# Patient Record
Sex: Male | Born: 1950 | Race: White | Hispanic: No | Marital: Single
Health system: Midwestern US, Academic
[De-identification: ages and names within clinical notes are randomized; demographics above are authoritative.]

---

## 2017-05-18 ENCOUNTER — Encounter: Admit: 2017-05-18 | Discharge: 2017-05-18 | Payer: MEDICARE

## 2017-05-18 DIAGNOSIS — M79602 Pain in left arm: ICD-10-CM

## 2017-05-18 DIAGNOSIS — S42309A Unspecified fracture of shaft of humerus, unspecified arm, initial encounter for closed fracture: ICD-10-CM

## 2017-05-18 DIAGNOSIS — I10 Essential (primary) hypertension: Principal | ICD-10-CM

## 2017-05-18 DIAGNOSIS — C61 Malignant neoplasm of prostate: ICD-10-CM

## 2017-05-18 DIAGNOSIS — R69 Illness, unspecified: ICD-10-CM

## 2017-05-18 MED ORDER — NALOXONE 0.4 MG/ML IJ SOLN
.08 mg | INTRAVENOUS | 0 refills | Status: DC | PRN
Start: 2017-05-18 — End: 2017-05-19

## 2017-05-18 MED ORDER — AMPICILLIN/SULBACTAM 3G/100ML NS IVPB (MB+)
3 g | INTRAVENOUS | 0 refills | Status: DC
Start: 2017-05-18 — End: 2017-05-26
  Administered 2017-05-19 – 2017-05-26 (×59): 3 g via INTRAVENOUS

## 2017-05-18 MED ORDER — DIPHTH,PERTUS(ACELL),TETANUS 2.5-8-5 LF-MCG-LF/0.5ML IM SUSP
.5 mL | Freq: Once | INTRAMUSCULAR | 0 refills | Status: CP
Start: 2017-05-18 — End: ?
  Administered 2017-05-19: 09:00:00 0.5 mL via INTRAMUSCULAR

## 2017-05-18 MED ORDER — SODIUM CHLORIDE 0.9 % IV SOLP
INTRAVENOUS | 0 refills | Status: DC
Start: 2017-05-18 — End: 2017-05-19
  Administered 2017-05-19 (×2): 1000.000 mL via INTRAVENOUS

## 2017-05-18 MED ORDER — KETAMINE 10 MG/ML IJ SOLN
.5 mg/kg | Freq: Once | INTRAVENOUS | 0 refills | Status: CP
Start: 2017-05-18 — End: ?
  Administered 2017-05-19: 05:00:00 39 mg via INTRAVENOUS

## 2017-05-18 MED ORDER — HYDROMORPHONE (PF) 2 MG/ML IJ SYRG
.5-1 mg | INTRAVENOUS | 0 refills | Status: DC | PRN
Start: 2017-05-18 — End: 2017-05-19
  Administered 2017-05-19 (×2): 1 mg via INTRAVENOUS

## 2017-05-18 MED ORDER — HYDROMORPHONE (PF)-0.9 % NACL 10 MG/50 ML SYR (STD CONC)(ADULT)(PREMADE)
INTRAVENOUS | 0 refills | Status: DC
Start: 2017-05-18 — End: 2017-05-19
  Administered 2017-05-19: 06:00:00 50.000 mL via INTRAVENOUS

## 2017-05-19 ENCOUNTER — Encounter: Admit: 2017-05-19 | Discharge: 2017-05-19 | Payer: MEDICARE

## 2017-05-19 ENCOUNTER — Inpatient Hospital Stay: Admit: 2017-05-19 | Discharge: 2017-05-19 | Payer: MEDICARE

## 2017-05-19 DIAGNOSIS — M79602 Pain in left arm: ICD-10-CM

## 2017-05-19 DIAGNOSIS — C61 Malignant neoplasm of prostate: ICD-10-CM

## 2017-05-19 DIAGNOSIS — I10 Essential (primary) hypertension: Principal | ICD-10-CM

## 2017-05-19 LAB — COMPREHENSIVE METABOLIC PANEL
Lab: 0.6 mg/dL (ref 0.3–1.2)
Lab: 1 mg/dL (ref 0.4–1.24)
Lab: 10 10*3/uL — ABNORMAL HIGH (ref 3–12)
Lab: 135 MMOL/L — ABNORMAL LOW (ref 137–147)
Lab: 17 MMOL/L — ABNORMAL LOW (ref 21–30)
Lab: 25 U/L (ref 7–56)
Lab: 34 U/L (ref 7–40)
Lab: 4.2 g/dL — ABNORMAL HIGH (ref 3.5–5.0)
Lab: 6.6 g/dL (ref 6.0–8.0)
Lab: 64 U/L — ABNORMAL LOW (ref 25–110)
Lab: 9.2 mg/dL (ref 8.5–10.6)
Lab: >60 mL/min — ABNORMAL HIGH (ref >60)
Lab: >60 mL/min — ABNORMAL LOW (ref >60)

## 2017-05-19 LAB — CBC AND DIFF
Lab: 0 10*3/uL (ref 0–0.20)
Lab: 0 10*3/uL (ref 0–0.45)
Lab: 15 10*3/uL — ABNORMAL HIGH (ref 4.5–11.0)

## 2017-05-19 LAB — PHOSPHORUS: Lab: 3.7 mg/dL — ABNORMAL LOW (ref 2.0–4.5)

## 2017-05-19 LAB — LACTIC ACID(LACTATE): Lab: 0.8 MMOL/L (ref 0.5–2.0)

## 2017-05-19 LAB — MAGNESIUM: Lab: 1.9 mg/dL — ABNORMAL LOW (ref 1.6–2.6)

## 2017-05-19 LAB — IONIZED CALCIUM: Lab: 1.1 MMOL/L — ABNORMAL HIGH (ref 1.0–1.3)

## 2017-05-19 MED ORDER — HYDROMORPHONE (PF) 10 MG/ML IJ SOLN
1 mg | Freq: Once | INTRAVENOUS | 0 refills | Status: DC
Start: 2017-05-19 — End: 2017-05-19

## 2017-05-19 MED ORDER — POLYETHYLENE GLYCOL 3350 17 GRAM PO PWPK
1 | Freq: Every day | ORAL | 0 refills | Status: DC
Start: 2017-05-19 — End: 2017-05-28
  Administered 2017-05-19 – 2017-05-25 (×3): 17 g via ORAL

## 2017-05-19 MED ORDER — DEXAMETHASONE SODIUM PHOSPHATE 4 MG/ML IJ SOLN
INTRAVENOUS | 0 refills | Status: DC
Start: 2017-05-19 — End: 2017-05-19
  Administered 2017-05-19: 07:00:00 4 mg via INTRAVENOUS

## 2017-05-19 MED ORDER — KETAMINE 10 MG/ML IJ SOLN
0 refills | Status: DC
Start: 2017-05-19 — End: 2017-05-19
  Administered 2017-05-19: 06:00:00 30 mg via INTRAVENOUS

## 2017-05-19 MED ORDER — SENNOSIDES 8.6 MG PO TAB
1 | Freq: Two times a day (BID) | ORAL | 0 refills | Status: DC
Start: 2017-05-19 — End: 2017-05-28
  Administered 2017-05-19 – 2017-05-28 (×12): 1 via ORAL

## 2017-05-19 MED ORDER — DEXTRAN 70-HYPROMELLOSE (PF) 0.1-0.3 % OP DPET
0 refills | Status: DC
Start: 2017-05-19 — End: 2017-05-19
  Administered 2017-05-19: 06:00:00 1 [drp] via OPHTHALMIC

## 2017-05-19 MED ORDER — OXYCODONE 5 MG PO TAB
5-10 mg | ORAL | 0 refills | Status: DC | PRN
Start: 2017-05-19 — End: 2017-05-19
  Administered 2017-05-19 (×2): 5 mg via ORAL

## 2017-05-19 MED ORDER — SODIUM CHLORIDE 0.9 % IV SOLP
0 refills | Status: DC
Start: 2017-05-19 — End: 2017-05-19
  Administered 2017-05-19: 06:00:00 via INTRAVENOUS

## 2017-05-19 MED ORDER — ONDANSETRON HCL (PF) 4 MG/2 ML IJ SOLN
4 mg | INTRAVENOUS | 0 refills | Status: DC | PRN
Start: 2017-05-19 — End: 2017-05-19
  Administered 2017-05-19: 09:00:00 4 mg via INTRAVENOUS

## 2017-05-19 MED ORDER — ENOXAPARIN 30 MG/0.3 ML SC SYRG
30 mg | Freq: Two times a day (BID) | SUBCUTANEOUS | 0 refills | Status: DC
Start: 2017-05-19 — End: 2017-05-28
  Administered 2017-05-19 – 2017-05-28 (×17): 30 mg via SUBCUTANEOUS

## 2017-05-19 MED ORDER — CITALOPRAM 20 MG PO TAB
20 mg | Freq: Every day | ORAL | 0 refills | Status: DC
Start: 2017-05-19 — End: 2017-05-28
  Administered 2017-05-19 – 2017-05-28 (×7): 20 mg via ORAL

## 2017-05-19 MED ORDER — SUGAMMADEX 100 MG/ML IV SOLN
INTRAVENOUS | 0 refills | Status: DC
Start: 2017-05-19 — End: 2017-05-19
  Administered 2017-05-19: 08:00:00 180 mg via INTRAVENOUS

## 2017-05-19 MED ORDER — MELATONIN 3 MG PO TAB
3 mg | Freq: Every evening | ORAL | 0 refills | Status: DC
Start: 2017-05-19 — End: 2017-05-27
  Administered 2017-05-20 – 2017-05-27 (×8): 3 mg via ORAL

## 2017-05-19 MED ORDER — GABAPENTIN 300 MG PO CAP
300 mg | ORAL | 0 refills | Status: DC
Start: 2017-05-19 — End: 2017-05-28
  Administered 2017-05-19 – 2017-05-28 (×25): 300 mg via ORAL

## 2017-05-19 MED ORDER — MIDAZOLAM 1 MG/ML IJ SOLN
INTRAVENOUS | 0 refills | Status: DC
Start: 2017-05-19 — End: 2017-05-19
  Administered 2017-05-19: 06:00:00 2 mg via INTRAVENOUS

## 2017-05-19 MED ORDER — MAGNESIUM HYDROXIDE 2,400 MG/10 ML PO SUSP
20 mL | Freq: Two times a day (BID) | ORAL | 0 refills | Status: DC
Start: 2017-05-19 — End: 2017-05-28
  Administered 2017-05-20: 14:00:00 20 mL via ORAL

## 2017-05-19 MED ORDER — OXYCODONE 5 MG PO TAB
5-15 mg | ORAL | 0 refills | Status: DC | PRN
Start: 2017-05-19 — End: 2017-05-20
  Administered 2017-05-19 (×2): 10 mg via ORAL
  Administered 2017-05-20 (×2): 15 mg via ORAL
  Administered 2017-05-20: 12:00:00 10 mg via ORAL

## 2017-05-19 MED ORDER — METHOCARBAMOL 750 MG PO TAB
750 mg | Freq: Three times a day (TID) | ORAL | 0 refills | Status: DC
Start: 2017-05-19 — End: 2017-05-28
  Administered 2017-05-19 – 2017-05-28 (×25): 750 mg via ORAL

## 2017-05-19 MED ORDER — SIMVASTATIN 20 MG PO TAB
80 mg | Freq: Every evening | ORAL | 0 refills | Status: DC
Start: 2017-05-19 — End: 2017-05-28
  Administered 2017-05-20 – 2017-05-28 (×9): 80 mg via ORAL

## 2017-05-19 MED ORDER — PHENYLEPHRINE IN 0.9% NACL(PF) 1 MG/10 ML (100 MCG/ML) IV SYRG
INTRAVENOUS | 0 refills | Status: DC
Start: 2017-05-19 — End: 2017-05-19
  Administered 2017-05-19 (×9): 100 ug via INTRAVENOUS
  Administered 2017-05-19: 07:00:00 200 ug via INTRAVENOUS
  Administered 2017-05-19 (×5): 100 ug via INTRAVENOUS

## 2017-05-19 MED ORDER — ONDANSETRON HCL (PF) 4 MG/2 ML IJ SOLN
INTRAVENOUS | 0 refills | Status: DC
Start: 2017-05-19 — End: 2017-05-19
  Administered 2017-05-19: 07:00:00 4 mg via INTRAVENOUS

## 2017-05-19 MED ORDER — ACETAMINOPHEN 325 MG PO TAB
650 mg | ORAL | 0 refills | Status: DC
Start: 2017-05-19 — End: 2017-05-28
  Administered 2017-05-19 – 2017-05-28 (×34): 650 mg via ORAL

## 2017-05-19 MED ORDER — ENALAPRIL MALEATE 10 MG PO TAB
10 mg | Freq: Every day | ORAL | 0 refills | Status: DC
Start: 2017-05-19 — End: 2017-05-20
  Administered 2017-05-19: 17:00:00 10 mg via ORAL

## 2017-05-19 MED ORDER — ROCURONIUM 10 MG/ML IV SOLN
INTRAVENOUS | 0 refills | Status: DC
Start: 2017-05-19 — End: 2017-05-19
  Administered 2017-05-19 (×2): 10 mg via INTRAVENOUS

## 2017-05-19 MED ORDER — SUCCINYLCHOLINE CHLORIDE 20 MG/ML IJ SOLN
INTRAVENOUS | 0 refills | Status: DC
Start: 2017-05-19 — End: 2017-05-19
  Administered 2017-05-19: 06:00:00 120 mg via INTRAVENOUS

## 2017-05-19 MED ORDER — PROPRANOLOL 120 MG PO CS24
120 mg | Freq: Every day | ORAL | 0 refills | Status: DC
Start: 2017-05-19 — End: 2017-05-20
  Administered 2017-05-19: 17:00:00 120 mg via ORAL

## 2017-05-19 MED ORDER — PROPOFOL INJ 10 MG/ML IV VIAL
0 refills | Status: DC
Start: 2017-05-19 — End: 2017-05-19
  Administered 2017-05-19: 06:00:00 100 mg via INTRAVENOUS
  Administered 2017-05-19: 07:00:00 50 mg via INTRAVENOUS

## 2017-05-19 MED ORDER — METHOCARBAMOL 750 MG PO TAB
750 mg | Freq: Two times a day (BID) | ORAL | 0 refills | Status: DC
Start: 2017-05-19 — End: 2017-05-19
  Administered 2017-05-19: 17:00:00 750 mg via ORAL

## 2017-05-19 MED ORDER — FENTANYL CITRATE (PF) 50 MCG/ML IJ SOLN
0 refills | Status: DC
Start: 2017-05-19 — End: 2017-05-19
  Administered 2017-05-19: 06:00:00 100 ug via INTRAVENOUS

## 2017-05-19 MED ORDER — HYDROMORPHONE (PF) 2 MG/ML IJ SYRG
1 mg | Freq: Once | INTRAVENOUS | 0 refills | Status: DC
Start: 2017-05-19 — End: 2017-05-19

## 2017-05-19 MED ORDER — ORTHO IRRIGATION 3 L BAG
Freq: Once | 0 refills | Status: DC
Start: 2017-05-19 — End: 2017-05-19

## 2017-05-19 MED ORDER — ONDANSETRON HCL (PF) 4 MG/2 ML IJ SOLN
4-8 mg | INTRAVENOUS | 0 refills | Status: DC | PRN
Start: 2017-05-19 — End: 2017-05-28
  Administered 2017-05-20 (×2): 4 mg via INTRAVENOUS

## 2017-05-19 MED ADMIN — SODIUM CHLORIDE 0.9 % IR SOLN [11403]: @ 05:00:00 | Stop: 2017-05-19 | NDC 00338004804

## 2017-05-20 ENCOUNTER — Inpatient Hospital Stay: Admit: 2017-05-20 | Discharge: 2017-05-20 | Payer: MEDICARE

## 2017-05-20 ENCOUNTER — Encounter: Admit: 2017-05-20 | Discharge: 2017-05-20 | Payer: MEDICARE

## 2017-05-20 DIAGNOSIS — M79602 Pain in left arm: Secondary | ICD-10-CM

## 2017-05-20 LAB — URINALYSIS DIPSTICK REFLEX TO CULTURE
Lab: NEGATIVE
Lab: NEGATIVE

## 2017-05-20 LAB — LACTIC ACID(LACTATE): Lab: 2.1 MMOL/L — ABNORMAL HIGH (ref 0.5–2.0)

## 2017-05-20 LAB — POC GLUCOSE: Lab: 168 mg/dL — ABNORMAL HIGH (ref 70–100)

## 2017-05-20 LAB — TSH WITH FREE T4 REFLEX: Lab: 0.9 uU/mL (ref 0.35–5.00)

## 2017-05-20 LAB — COMPREHENSIVE METABOLIC PANEL
Lab: 0.3 mg/dL — ABNORMAL HIGH (ref 0.3–1.2)
Lab: 1.1 mg/dL (ref 0.4–1.24)
Lab: 11 10*3/uL (ref 3–12)
Lab: 142 MMOL/L — ABNORMAL LOW (ref 137–147)
Lab: 158 mg/dL — ABNORMAL HIGH (ref 70–100)
Lab: 17 U/L — ABNORMAL HIGH (ref 7–56)
Lab: 22 mg/dL (ref 7–25)
Lab: 25 MMOL/L (ref 21–30)
Lab: 3 g/dL — ABNORMAL LOW (ref 3.5–5.0)
Lab: 30 U/L (ref 7–40)
Lab: 47 U/L (ref 25–110)
Lab: 5.4 g/dL — ABNORMAL LOW (ref 6.0–8.0)
Lab: 7.5 mg/dL — ABNORMAL LOW (ref 8.5–10.6)
Lab: >60 mL/min (ref >60)
Lab: >60 mL/min — ABNORMAL HIGH (ref >60)

## 2017-05-20 LAB — PHOSPHORUS
Lab: 2.8 mg/dL — ABNORMAL LOW (ref 2.0–4.5)
Lab: 2 mg/dL (ref 2.0–4.5)

## 2017-05-20 LAB — TROPONIN-I: Lab: 0 ng/mL (ref 0.0–0.05)

## 2017-05-20 LAB — URINALYSIS MICROSCOPIC REFLEX TO CULTURE

## 2017-05-20 LAB — BASIC METABOLIC PANEL: Lab: 136 MMOL/L — ABNORMAL LOW (ref >60)

## 2017-05-20 LAB — CBC AND DIFF
Lab: 14 K/UL — ABNORMAL HIGH (ref 4.5–11.0)
Lab: 0 10*3/uL (ref 0–0.20)
Lab: 12 10*3/uL — ABNORMAL HIGH (ref 4.5–11.0)
Lab: 2.4 M/UL — ABNORMAL LOW (ref 4.4–5.5)

## 2017-05-20 LAB — MAGNESIUM
Lab: 2.4 mg/dL — ABNORMAL LOW (ref >60)
Lab: 2.4 mg/dL — ABNORMAL LOW (ref 1.6–2.6)

## 2017-05-20 LAB — 25-OH VITAMIN D (D2 + D3): Lab: 10 ng/mL — ABNORMAL LOW (ref 30–80)

## 2017-05-20 MED ORDER — SODIUM CHLORIDE 0.9 % IV SOLP
500 mL | INTRAVENOUS | 0 refills | Status: CP
Start: 2017-05-20 — End: ?
  Administered 2017-05-20: 08:00:00 500 mL via INTRAVENOUS

## 2017-05-20 MED ORDER — LACTATED RINGERS IV SOLP
500 mL | INTRAVENOUS | 0 refills | Status: CP
Start: 2017-05-20 — End: ?
  Administered 2017-05-20: 15:00:00 500 mL via INTRAVENOUS

## 2017-05-20 MED ORDER — CALCIUM CARBONATE 200 MG CALCIUM (500 MG) PO CHEW
500 mg | Freq: Every day | ORAL | 0 refills | Status: DC
Start: 2017-05-20 — End: 2017-05-28
  Administered 2017-05-20 – 2017-05-28 (×6): 500 mg via ORAL

## 2017-05-20 MED ORDER — ERGOCALCIFEROL (VITAMIN D2) 50,000 UNIT PO CAP
50000 [IU] | ORAL | 0 refills | Status: DC
Start: 2017-05-20 — End: 2017-05-28
  Administered 2017-05-21 – 2017-05-28 (×2): 50000 [IU] via ORAL

## 2017-05-20 MED ORDER — SODIUM CHLORIDE 0.9 % IV SOLP
1000 mL | INTRAVENOUS | 0 refills | Status: DC
Start: 2017-05-20 — End: 2017-05-20

## 2017-05-20 MED ORDER — OXYCODONE 5 MG PO TAB
5 mg | ORAL | 0 refills | Status: DC | PRN
Start: 2017-05-20 — End: 2017-05-21
  Administered 2017-05-21: 09:00:00 5 mg via ORAL

## 2017-05-20 MED ORDER — IOHEXOL 350 MG IODINE/ML IV SOLN
80 mL | Freq: Once | INTRAVENOUS | 0 refills | Status: CP
Start: 2017-05-20 — End: ?
  Administered 2017-05-20: 23:00:00 80 mL via INTRAVENOUS

## 2017-05-20 MED ORDER — LACTATED RINGERS IV SOLP
500 mL | INTRAVENOUS | 0 refills | Status: CP
Start: 2017-05-20 — End: ?
  Administered 2017-05-20: 21:00:00 500 mL via INTRAVENOUS

## 2017-05-20 MED ORDER — TRAZODONE 50 MG PO TAB
50 mg | Freq: Every evening | ORAL | 0 refills | Status: DC | PRN
Start: 2017-05-20 — End: 2017-05-27
  Administered 2017-05-22 – 2017-05-25 (×4): 50 mg via ORAL

## 2017-05-20 MED ORDER — SODIUM CHLORIDE 0.9 % IJ SOLN
50 mL | Freq: Once | INTRAVENOUS | 0 refills | Status: CP
Start: 2017-05-20 — End: ?
  Administered 2017-05-20: 23:00:00 50 mL via INTRAVENOUS

## 2017-05-20 MED ORDER — INSULIN ASPART 100 UNIT/ML SC FLEXPEN
0-6 [IU] | Freq: Before meals | SUBCUTANEOUS | 0 refills | Status: DC
Start: 2017-05-20 — End: 2017-05-27
  Administered 2017-05-22: 2 [IU] via SUBCUTANEOUS
  Administered 2017-05-23: 03:00:00 1 [IU] via SUBCUTANEOUS

## 2017-05-21 ENCOUNTER — Inpatient Hospital Stay: Admit: 2017-05-25 | Discharge: 2017-05-25 | Payer: MEDICARE

## 2017-05-21 ENCOUNTER — Encounter: Admit: 2017-05-21 | Discharge: 2017-05-21 | Payer: MEDICARE

## 2017-05-21 DIAGNOSIS — I10 Essential (primary) hypertension: Principal | ICD-10-CM

## 2017-05-21 DIAGNOSIS — M79602 Pain in left arm: Secondary | ICD-10-CM

## 2017-05-21 DIAGNOSIS — C61 Malignant neoplasm of prostate: ICD-10-CM

## 2017-05-21 LAB — CBC
Lab: 12 10*3/uL — ABNORMAL HIGH (ref 4.5–11.0)
Lab: 13 % (ref 11–15)
Lab: 207 K/UL (ref 150–400)
Lab: 26 % — ABNORMAL LOW (ref 40–50)
Lab: 33 pg (ref 26–34)
Lab: 7.8 FL — ABNORMAL LOW (ref >60)
Lab: 98 FL (ref 80–100)
Lab: 13 % (ref 11–15)
Lab: 2.1 M/UL — ABNORMAL LOW (ref 4.4–5.5)
Lab: 21 % — ABNORMAL LOW (ref 40–50)
Lab: 7.7 K/UL — ABNORMAL HIGH (ref 4.5–11.0)
Lab: 97 FL — ABNORMAL HIGH (ref >60)
Lab: 2.7 M/UL — ABNORMAL LOW (ref 4.4–5.5)
Lab: 26 % — ABNORMAL LOW (ref 40–50)
Lab: 9.7 K/UL — ABNORMAL LOW (ref 4.5–11.0)

## 2017-05-21 LAB — POC GLUCOSE
Lab: 138 mg/dL — ABNORMAL HIGH (ref 70–100)
Lab: 150 mg/dL — ABNORMAL HIGH (ref 70–100)
Lab: 163 mg/dL — ABNORMAL HIGH (ref 70–100)
Lab: 201 mg/dL — ABNORMAL HIGH (ref 70–100)
Lab: 81 mg/dL (ref 70–100)

## 2017-05-21 LAB — BASIC METABOLIC PANEL: Lab: 137 MMOL/L — ABNORMAL LOW (ref 137–147)

## 2017-05-21 LAB — CBC AND DIFF: Lab: 9.8 K/UL — ABNORMAL LOW (ref 4.5–11.0)

## 2017-05-21 LAB — HEMOGLOBIN A1C: Lab: 5.7 % — ABNORMAL LOW (ref >60)

## 2017-05-21 LAB — MAGNESIUM: Lab: 2.5 mg/dL — ABNORMAL LOW (ref 1.6–2.6)

## 2017-05-21 LAB — LACTIC ACID (BG - RAPID LACTATE): Lab: 1.2 MMOL/L — ABNORMAL LOW (ref 0.5–2.0)

## 2017-05-21 LAB — PHOSPHORUS: Lab: 2.2 mg/dL — ABNORMAL HIGH (ref >60)

## 2017-05-21 MED ORDER — LACTATED RINGERS IV SOLP
INTRAVENOUS | 0 refills | Status: DC
Start: 2017-05-21 — End: 2017-05-22
  Administered 2017-05-21 (×2): 1000.000 mL via INTRAVENOUS

## 2017-05-21 MED ORDER — OXYCODONE 5 MG PO TAB
5-10 mg | ORAL | 0 refills | Status: DC | PRN
Start: 2017-05-21 — End: 2017-05-23
  Administered 2017-05-22 – 2017-05-23 (×5): 10 mg via ORAL

## 2017-05-22 ENCOUNTER — Encounter: Admit: 2017-05-22 | Discharge: 2017-05-22 | Payer: MEDICARE

## 2017-05-22 DIAGNOSIS — C61 Malignant neoplasm of prostate: ICD-10-CM

## 2017-05-22 DIAGNOSIS — I10 Essential (primary) hypertension: Principal | ICD-10-CM

## 2017-05-22 DIAGNOSIS — M79602 Pain in left arm: Secondary | ICD-10-CM

## 2017-05-22 LAB — POC GLUCOSE
Lab: 109 mg/dL — ABNORMAL HIGH (ref 70–100)
Lab: 108 mg/dL — ABNORMAL HIGH (ref 70–100)
Lab: 87 mg/dL (ref 70–100)
Lab: 127 mg/dL — ABNORMAL HIGH (ref 70–100)
Lab: 238 mg/dL — ABNORMAL HIGH (ref 70–100)

## 2017-05-22 LAB — BASIC METABOLIC PANEL
Lab: 159 MMOL/L — ABNORMAL HIGH (ref 137–147)
Lab: 0.9 mg/dL (ref 0.4–1.24)
Lab: 106 MMOL/L (ref 98–110)
Lab: 116 mg/dL — ABNORMAL HIGH (ref 70–100)
Lab: 13 mg/dL (ref 7–25)
Lab: 138 MMOL/L (ref 137–147)
Lab: 26 MMOL/L (ref 21–30)
Lab: 3.7 MMOL/L (ref 3.5–5.1)
Lab: 6 (ref 3–12)
Lab: 8.1 mg/dL — ABNORMAL LOW (ref 8.5–10.6)
Lab: >60 mL/min (ref >60)
Lab: >60 mL/min (ref >60)

## 2017-05-22 LAB — POC HEMATOCRIT
Lab: 25 % — ABNORMAL LOW (ref 40–50)
Lab: 8.5 g/dL — ABNORMAL LOW (ref 13.5–16.5)

## 2017-05-22 LAB — CBC
Lab: 10 K/UL (ref 4.5–11.0)
Lab: 2.2 M/UL — ABNORMAL LOW (ref 4.4–5.5)

## 2017-05-22 LAB — COMPREHENSIVE METABOLIC PANEL
Lab: 0.6 mg/dL — ABNORMAL LOW (ref 0.3–1.2)
Lab: 0.9 mg/dL (ref 0.4–1.24)
Lab: 103 MMOL/L — AB (ref 98–110)
Lab: 136 MMOL/L — ABNORMAL LOW (ref 137–147)
Lab: 14 mg/dL — ABNORMAL HIGH (ref 7–25)
Lab: 22 U/L — ABNORMAL LOW (ref 7–56)
Lab: 223 mg/dL — ABNORMAL HIGH (ref 70–100)
Lab: 25 MMOL/L — ABNORMAL HIGH (ref 21–30)
Lab: 3.3 g/dL — ABNORMAL LOW (ref 3.5–5.0)
Lab: 3.7 MMOL/L (ref 3.5–5.1)
Lab: 30 U/L (ref 7–40)
Lab: 5.9 g/dL — ABNORMAL LOW (ref 6.0–8.0)
Lab: 60 U/L (ref 25–110)
Lab: 8 10*3/uL (ref 3–12)
Lab: 8.5 mg/dL (ref 8.5–10.6)
Lab: >60 mL/min (ref >60)
Lab: >60 mL/min (ref >60)

## 2017-05-22 LAB — POC BLOOD GAS VEN
Lab: 1 MMOL/L
Lab: 25 mmHg — ABNORMAL LOW (ref 33–48)
Lab: 38 mmHg (ref 36–50)
Lab: 7.4 — ABNORMAL HIGH (ref 7.30–7.40)

## 2017-05-22 LAB — PHOSPHORUS
Lab: 2 mg/dL — ABNORMAL LOW (ref 2.0–4.5)
Lab: 3.3 mg/dL — ABNORMAL LOW (ref 2.0–4.5)

## 2017-05-22 LAB — POC POTASSIUM: Lab: 3.8 MMOL/L (ref >60)

## 2017-05-22 LAB — MAGNESIUM
Lab: 1.6 mg/dL — ABNORMAL LOW (ref 1.6–2.6)
Lab: 2.1 mg/dL (ref 1.6–2.6)
Lab: 2 mg/dL — ABNORMAL LOW (ref 1.6–2.6)

## 2017-05-22 LAB — POC IONIZED CALCIUM: Lab: 1.1 MMOL/L — ABNORMAL HIGH (ref 1.0–1.3)

## 2017-05-22 LAB — CBC AND DIFF
Lab: 5.9 10*3/uL — ABNORMAL LOW (ref 4.5–11.0)
Lab: 9.4 10*3/uL — AB (ref 4.5–11.0)

## 2017-05-22 LAB — POC SODIUM: Lab: 138 MMOL/L (ref 137–147)

## 2017-05-22 MED ORDER — OXYCODONE 5 MG PO TAB
5-10 mg | Freq: Once | ORAL | 0 refills | Status: DC | PRN
Start: 2017-05-22 — End: 2017-05-22

## 2017-05-22 MED ORDER — PROPOFOL INJ 10 MG/ML IV VIAL
0 refills | Status: DC
Start: 2017-05-22 — End: 2017-05-22
  Administered 2017-05-22: 13:00:00 120 mg via INTRAVENOUS
  Administered 2017-05-22: 13:00:00 30 mg via INTRAVENOUS

## 2017-05-22 MED ORDER — ONDANSETRON HCL (PF) 4 MG/2 ML IJ SOLN
INTRAVENOUS | 0 refills | Status: DC
Start: 2017-05-22 — End: 2017-05-22
  Administered 2017-05-22: 14:00:00 4 mg via INTRAVENOUS

## 2017-05-22 MED ORDER — LACTATED RINGERS IV SOLP
1000 mL | INTRAVENOUS | 0 refills | Status: DC
Start: 2017-05-22 — End: 2017-05-22
  Administered 2017-05-22: 12:00:00 1000 mL via INTRAVENOUS

## 2017-05-22 MED ORDER — PROMETHAZINE 25 MG/ML IJ SOLN
6.25 mg | INTRAVENOUS | 0 refills | Status: DC | PRN
Start: 2017-05-22 — End: 2017-05-22

## 2017-05-22 MED ORDER — DEXAMETHASONE SODIUM PHOSPHATE 4 MG/ML IJ SOLN
INTRAVENOUS | 0 refills | Status: DC
Start: 2017-05-22 — End: 2017-05-22
  Administered 2017-05-22: 13:00:00 4 mg via INTRAVENOUS

## 2017-05-22 MED ORDER — MEPERIDINE (PF) 25 MG/ML IJ SYRG
12.5 mg | INTRAVENOUS | 0 refills | Status: DC | PRN
Start: 2017-05-22 — End: 2017-05-22

## 2017-05-22 MED ORDER — CEFAZOLIN 1 GRAM IJ SOLR
0 refills | Status: DC
Start: 2017-05-22 — End: 2017-05-22
  Administered 2017-05-22: 13:00:00 2 g via INTRAVENOUS

## 2017-05-22 MED ORDER — MIDAZOLAM 1 MG/ML IJ SOLN
INTRAVENOUS | 0 refills | Status: DC
Start: 2017-05-22 — End: 2017-05-22
  Administered 2017-05-22: 13:00:00 2 mg via INTRAVENOUS

## 2017-05-22 MED ORDER — LIDOCAINE (PF) 200 MG/10 ML (2 %) IJ SYRG
0 refills | Status: DC
Start: 2017-05-22 — End: 2017-05-22
  Administered 2017-05-22: 13:00:00 100 mg via INTRAVENOUS

## 2017-05-22 MED ORDER — HYDROMORPHONE (PF) 2 MG/ML IJ SYRG
.5 mg | INTRAVENOUS | 0 refills | Status: DC | PRN
Start: 2017-05-22 — End: 2017-05-22
  Administered 2017-05-22 (×3): 0.5 mg via INTRAVENOUS

## 2017-05-22 MED ORDER — IRON SUCROSE 100 MG IRON/5 ML IV SOLN
200 mg | Freq: Once | INTRAVENOUS | 0 refills | Status: CP
Start: 2017-05-22 — End: ?
  Administered 2017-05-22: 22:00:00 200 mg via INTRAVENOUS

## 2017-05-22 MED ORDER — FENTANYL CITRATE (PF) 50 MCG/ML IJ SOLN
0 refills | Status: DC
Start: 2017-05-22 — End: 2017-05-22
  Administered 2017-05-22 (×6): 25 ug via INTRAVENOUS

## 2017-05-22 MED ORDER — ROPIVACAINE (PF) 5 MG/ML (0.5 %) IJ SOLN
0 refills | Status: DC
Start: 2017-05-22 — End: 2017-05-22
  Administered 2017-05-22: 17:00:00 25 mL

## 2017-05-22 MED ORDER — FENTANYL CITRATE (PF) 50 MCG/ML IJ SOLN
50 ug | INTRAVENOUS | 0 refills | Status: DC | PRN
Start: 2017-05-22 — End: 2017-05-22

## 2017-05-22 MED ORDER — DEXAMETHASONE SODIUM PHOS (PF) 10 MG/ML IJ SOLN
4 mg | Freq: Once | INTRAVENOUS | 0 refills | Status: DC | PRN
Start: 2017-05-22 — End: 2017-05-22

## 2017-05-22 MED ORDER — FENTANYL CITRATE (PF) 50 MCG/ML IJ SOLN
25 ug | INTRAVENOUS | 0 refills | Status: DC | PRN
Start: 2017-05-22 — End: 2017-05-22
  Administered 2017-05-22: 15:00:00 25 ug via INTRAVENOUS

## 2017-05-22 MED ORDER — DEXTRAN 70-HYPROMELLOSE (PF) 0.1-0.3 % OP DPET
0 refills | Status: DC
Start: 2017-05-22 — End: 2017-05-22
  Administered 2017-05-22: 13:00:00 2 [drp] via OPHTHALMIC

## 2017-05-23 LAB — POC GLUCOSE
Lab: 236 mg/dL — ABNORMAL HIGH (ref 70–100)
Lab: 162 mg/dL — ABNORMAL HIGH (ref 70–100)
Lab: 136 mg/dL — ABNORMAL HIGH (ref 70–100)
Lab: 114 mg/dL — ABNORMAL HIGH (ref 70–100)

## 2017-05-23 LAB — MAGNESIUM: Lab: 2.2 mg/dL — ABNORMAL LOW (ref 1.6–2.6)

## 2017-05-23 LAB — CBC AND DIFF: Lab: 11 K/UL — ABNORMAL HIGH (ref 4.5–11.0)

## 2017-05-23 LAB — BASIC METABOLIC PANEL: Lab: 136 MMOL/L — ABNORMAL LOW (ref >60)

## 2017-05-23 LAB — PHOSPHORUS: Lab: 3.2 mg/dL — ABNORMAL HIGH (ref 2.0–4.5)

## 2017-05-23 MED ORDER — FENTANYL CITRATE (PF) 50 MCG/ML IJ SOLN
50 ug | Freq: Once | INTRAVENOUS | 0 refills | Status: CP
Start: 2017-05-23 — End: ?
  Administered 2017-05-23: 16:00:00 50 ug via INTRAVENOUS

## 2017-05-23 MED ORDER — OXYCODONE 5 MG PO TAB
5-15 mg | ORAL | 0 refills | Status: DC | PRN
Start: 2017-05-23 — End: 2017-05-28
  Administered 2017-05-23 – 2017-05-28 (×27): 15 mg via ORAL

## 2017-05-24 LAB — POC GLUCOSE
Lab: 154 mg/dL — ABNORMAL HIGH (ref 70–100)
Lab: 92 mg/dL (ref 70–100)
Lab: 113 mg/dL — ABNORMAL HIGH (ref 70–100)
Lab: 95 mg/dL (ref 70–100)

## 2017-05-24 LAB — MAGNESIUM: Lab: 2 mg/dL — ABNORMAL HIGH (ref 1.6–2.6)

## 2017-05-24 LAB — CBC
Lab: 16 % — ABNORMAL HIGH (ref 11–15)
Lab: 28 % — ABNORMAL LOW (ref 40–50)
Lab: 3 M/UL — ABNORMAL LOW (ref 4.4–5.5)
Lab: 306 K/UL (ref 150–400)
Lab: 32 pg — ABNORMAL LOW (ref >60)
Lab: 34 g/dL (ref >60)
Lab: 7.2 FL (ref 7–11)
Lab: 9.7 K/UL — ABNORMAL HIGH (ref 4.5–11.0)
Lab: 9.8 g/dL — ABNORMAL LOW (ref 13.5–16.5)

## 2017-05-24 LAB — BASIC METABOLIC PANEL: Lab: 138 MMOL/L — ABNORMAL LOW (ref 137–147)

## 2017-05-24 LAB — PHOSPHORUS: Lab: 4.1 mg/dL — ABNORMAL LOW (ref >60)

## 2017-05-24 MED ORDER — POTASSIUM CHL SR TAB-MG/AL HYD 30ML
Freq: Once | ORAL | 0 refills | Status: CP
Start: 2017-05-24 — End: ?
  Administered 2017-05-24 (×2): via ORAL

## 2017-05-25 ENCOUNTER — Encounter: Admit: 2017-05-25 | Discharge: 2017-05-25 | Payer: MEDICARE

## 2017-05-25 DIAGNOSIS — M79602 Pain in left arm: Secondary | ICD-10-CM

## 2017-05-25 DIAGNOSIS — I10 Essential (primary) hypertension: Principal | ICD-10-CM

## 2017-05-25 DIAGNOSIS — C61 Malignant neoplasm of prostate: ICD-10-CM

## 2017-05-25 LAB — POC GLUCOSE
Lab: 101 mg/dL — ABNORMAL HIGH (ref 70–100)
Lab: 92 mg/dL (ref 70–100)
Lab: 103 mg/dL — ABNORMAL HIGH (ref 70–100)
Lab: 96 mg/dL (ref 70–100)

## 2017-05-25 LAB — CBC
Lab: 10 K/UL — ABNORMAL HIGH (ref 4.5–11.0)
Lab: 29 % — ABNORMAL LOW (ref 40–50)

## 2017-05-26 ENCOUNTER — Encounter: Admit: 2017-05-26 | Discharge: 2017-05-26 | Payer: MEDICARE

## 2017-05-26 DIAGNOSIS — C61 Malignant neoplasm of prostate: ICD-10-CM

## 2017-05-26 DIAGNOSIS — I10 Essential (primary) hypertension: Principal | ICD-10-CM

## 2017-05-26 LAB — POC GLUCOSE
Lab: 119 mg/dL — ABNORMAL HIGH (ref 70–100)
Lab: 110 mg/dL — ABNORMAL HIGH (ref 70–100)
Lab: 151 mg/dL — ABNORMAL HIGH (ref 70–100)
Lab: 138 mg/dL — ABNORMAL HIGH (ref 70–100)

## 2017-05-26 LAB — CBC
Lab: 10 10*3/uL (ref 4.5–11.0)
Lab: 10 g/dL — ABNORMAL LOW (ref 13.5–16.5)
Lab: 14 % (ref 11–15)
Lab: 3.3 M/UL — ABNORMAL LOW (ref 4.4–5.5)
Lab: 30 % — ABNORMAL LOW (ref 40–50)
Lab: 32 pg (ref 26–34)
Lab: 34 g/dL (ref 32.0–36.0)
Lab: 356 10*3/uL (ref 150–400)
Lab: 7.1 FL (ref 7–11)
Lab: 92 FL (ref 80–100)

## 2017-05-26 MED ORDER — SODIUM CHLORIDE 0.9 % IV SOLP
1000 mL | INTRAVENOUS | 0 refills | Status: DC
Start: 2017-05-26 — End: 2017-05-27
  Administered 2017-05-26: 12:00:00 1000 mL via INTRAVENOUS
  Administered 2017-05-26: 16:00:00 1000.000 mL via INTRAVENOUS

## 2017-05-26 MED ORDER — FENTANYL CITRATE (PF) 50 MCG/ML IJ SOLN
50 ug | INTRAVENOUS | 0 refills | Status: DC | PRN
Start: 2017-05-26 — End: 2017-05-26
  Administered 2017-05-26 (×2): 50 ug via INTRAVENOUS

## 2017-05-26 MED ORDER — MIDAZOLAM 1 MG/ML IJ SOLN
INTRAVENOUS | 0 refills | Status: DC
Start: 2017-05-26 — End: 2017-05-26
  Administered 2017-05-26: 13:00:00 2 mg via INTRAVENOUS

## 2017-05-26 MED ORDER — TOBRAMYCIN SULFATE 1.2 GRAM IJ SOLR
0 refills | Status: DC
Start: 2017-05-26 — End: 2017-05-26
  Administered 2017-05-26: 17:00:00 1.2 g via TOPICAL

## 2017-05-26 MED ORDER — EPHEDRINE SULFATE 50 MG/5ML SYR (10 MG/ML) (AN)(OSM)
0 refills | Status: DC
Start: 2017-05-26 — End: 2017-05-26
  Administered 2017-05-26 (×4): 10 mg via INTRAVENOUS

## 2017-05-26 MED ORDER — ONDANSETRON HCL (PF) 4 MG/2 ML IJ SOLN
INTRAVENOUS | 0 refills | Status: DC
Start: 2017-05-26 — End: 2017-05-26
  Administered 2017-05-26: 17:00:00 4 mg via INTRAVENOUS

## 2017-05-26 MED ORDER — ROCURONIUM 10 MG/ML IV SOLN
INTRAVENOUS | 0 refills | Status: DC
Start: 2017-05-26 — End: 2017-05-26
  Administered 2017-05-26: 16:00:00 10 mg via INTRAVENOUS
  Administered 2017-05-26: 13:00:00 5 mg via INTRAVENOUS
  Administered 2017-05-26 (×5): 10 mg via INTRAVENOUS
  Administered 2017-05-26: 14:00:00 30 mg via INTRAVENOUS

## 2017-05-26 MED ORDER — HYDROMORPHONE (PF) 2 MG/ML IJ SYRG
.5 mg | INTRAVENOUS | 0 refills | Status: DC | PRN
Start: 2017-05-26 — End: 2017-05-26
  Administered 2017-05-26: 18:00:00 0.5 mg via INTRAVENOUS

## 2017-05-26 MED ORDER — ROPIVACAINE (PF) 5 MG/ML (0.5 %) IJ SOLN
0 refills | Status: DC
Start: 2017-05-26 — End: 2017-05-26
  Administered 2017-05-26: 20:00:00 30 mL

## 2017-05-26 MED ORDER — PROPOFOL INJ 10 MG/ML IV VIAL
0 refills | Status: DC
Start: 2017-05-26 — End: 2017-05-26
  Administered 2017-05-26: 13:00:00 120 mg via INTRAVENOUS

## 2017-05-26 MED ORDER — LIDOCAINE (PF) 200 MG/10 ML (2 %) IJ SYRG
0 refills | Status: DC
Start: 2017-05-26 — End: 2017-05-26
  Administered 2017-05-26: 13:00:00 80 mg via INTRAVENOUS

## 2017-05-26 MED ORDER — FENTANYL CITRATE (PF) 50 MCG/ML IJ SOLN
0 refills | Status: DC
Start: 2017-05-26 — End: 2017-05-26
  Administered 2017-05-26: 13:00:00 100 ug via INTRAVENOUS

## 2017-05-26 MED ORDER — AMPICILLIN/SULBACTAM 3G/100ML NS IVPB (MB+)
3 g | INTRAVENOUS | 0 refills | Status: CP
Start: 2017-05-26 — End: ?
  Administered 2017-05-26 – 2017-05-28 (×16): 3 g via INTRAVENOUS

## 2017-05-26 MED ORDER — PHENYLEPHRINE IN 0.9% NACL(PF) 1 MG/10 ML (100 MCG/ML) IV SYRG
INTRAVENOUS | 0 refills | Status: DC
Start: 2017-05-26 — End: 2017-05-26
  Administered 2017-05-26 (×4): 100 ug via INTRAVENOUS
  Administered 2017-05-26: 14:00:00 200 ug via INTRAVENOUS
  Administered 2017-05-26: 14:00:00 100 ug via INTRAVENOUS

## 2017-05-26 MED ORDER — SUCCINYLCHOLINE CHLORIDE 20 MG/ML IJ SOLN
INTRAVENOUS | 0 refills | Status: DC
Start: 2017-05-26 — End: 2017-05-26
  Administered 2017-05-26: 13:00:00 100 mg via INTRAVENOUS

## 2017-05-26 MED ORDER — KETAMINE 10 MG/ML IJ SOLN
0 refills | Status: DC
Start: 2017-05-26 — End: 2017-05-26
  Administered 2017-05-26 (×2): 10 mg via INTRAVENOUS
  Administered 2017-05-26: 14:00:00 20 mg via INTRAVENOUS
  Administered 2017-05-26 (×2): 10 mg via INTRAVENOUS

## 2017-05-26 MED ORDER — DEXTRAN 70-HYPROMELLOSE (PF) 0.1-0.3 % OP DPET
0 refills | Status: DC
Start: 2017-05-26 — End: 2017-05-26
  Administered 2017-05-26: 13:00:00 2 [drp] via OPHTHALMIC

## 2017-05-26 MED ORDER — VANCOMYCIN 1,000 MG IV SOLR
0 refills | Status: DC
Start: 2017-05-26 — End: 2017-05-26
  Administered 2017-05-26: 17:00:00 2 g via TOPICAL

## 2017-05-26 MED ORDER — PHENYLEPHRINE IV DRIP (STD CONC)
0 refills | Status: DC
Start: 2017-05-26 — End: 2017-05-26
  Administered 2017-05-26 (×2): .5 ug/kg/min via INTRAVENOUS

## 2017-05-26 MED ORDER — HALOPERIDOL LACTATE 5 MG/ML IJ SOLN
1 mg | Freq: Once | INTRAVENOUS | 0 refills | Status: CP | PRN
Start: 2017-05-26 — End: ?
  Administered 2017-05-26: 18:00:00 1 mg via INTRAVENOUS

## 2017-05-26 MED ORDER — OXYCODONE 5 MG PO TAB
5 mg | Freq: Once | ORAL | 0 refills | Status: DC | PRN
Start: 2017-05-26 — End: 2017-05-26

## 2017-05-26 MED ORDER — DEXAMETHASONE SODIUM PHOSPHATE 4 MG/ML IJ SOLN
INTRAVENOUS | 0 refills | Status: DC
Start: 2017-05-26 — End: 2017-05-26
  Administered 2017-05-26 (×2): 4 mg via INTRAVENOUS

## 2017-05-26 MED ORDER — HYDROMORPHONE 2 MG/ML IJ SOLN
0 refills | Status: DC
Start: 2017-05-26 — End: 2017-05-26
  Administered 2017-05-26: 17:00:00 .5 mg via INTRAVENOUS
  Administered 2017-05-26 (×2): 0.5 mg via INTRAVENOUS

## 2017-05-27 LAB — PHOSPHORUS: Lab: 2.6 mg/dL — ABNORMAL LOW (ref 2.0–4.5)

## 2017-05-27 LAB — POC GLUCOSE
Lab: 153 mg/dL — ABNORMAL HIGH (ref 70–100)
Lab: 118 mg/dL — ABNORMAL HIGH (ref 70–100)
Lab: 103 mg/dL — ABNORMAL HIGH (ref 70–100)

## 2017-05-27 LAB — BASIC METABOLIC PANEL
Lab: 1 mg/dL — ABNORMAL HIGH (ref 0.4–1.24)
Lab: 132 MMOL/L — ABNORMAL LOW (ref >=60)
Lab: 17 mg/dL — ABNORMAL HIGH (ref 7–25)
Lab: 23 MMOL/L (ref 21–30)
Lab: 7 pg (ref 3–12)
Lab: 8.4 mg/dL — ABNORMAL LOW (ref 8.5–10.6)
Lab: 95 mg/dL (ref 70–100)
Lab: >60 mL/min (ref >60)
Lab: >60 mL/min (ref >60)

## 2017-05-27 LAB — CBC: Lab: 13 K/UL — ABNORMAL HIGH (ref 4.5–11.0)

## 2017-05-27 LAB — MAGNESIUM: Lab: 2.2 mg/dL — ABNORMAL LOW (ref >=60)

## 2017-05-27 MED ORDER — FENTANYL CITRATE (PF) 50 MCG/ML IJ SOLN
25 ug | INTRAVENOUS | 0 refills | Status: DC | PRN
Start: 2017-05-27 — End: 2017-05-28
  Administered 2017-05-27: 12:00:00 25 ug via INTRAVENOUS

## 2017-05-27 MED ORDER — MELATONIN 5 MG PO TAB
5 mg | Freq: Every evening | ORAL | 0 refills | Status: DC
Start: 2017-05-27 — End: 2017-05-28
  Administered 2017-05-28: 02:00:00 5 mg via ORAL

## 2017-05-27 MED ORDER — TRAZODONE 50 MG PO TAB
50 mg | Freq: Every evening | ORAL | 0 refills | Status: DC
Start: 2017-05-27 — End: 2017-05-28
  Administered 2017-05-28: 02:00:00 50 mg via ORAL

## 2017-05-27 MED ORDER — POTASSIUM PHOSPHATE, MONOBASIC 500 MG PO TBSO
2 | Freq: Once | ORAL | 0 refills | Status: CP
Start: 2017-05-27 — End: ?
  Administered 2017-05-27: 20:00:00 2 via ORAL

## 2017-05-28 ENCOUNTER — Inpatient Hospital Stay: Admit: 2017-05-19 | Discharge: 2017-05-19 | Payer: MEDICARE

## 2017-05-28 ENCOUNTER — Ambulatory Visit: Admit: 2017-05-26 | Discharge: 2017-05-27 | Payer: MEDICARE

## 2017-05-28 ENCOUNTER — Ambulatory Visit: Admit: 2017-05-22 | Discharge: 2017-05-23 | Payer: MEDICARE

## 2017-05-28 ENCOUNTER — Ambulatory Visit: Admit: 2017-05-26 | Discharge: 2017-05-26 | Payer: MEDICARE

## 2017-05-28 ENCOUNTER — Inpatient Hospital Stay: Admit: 2017-05-22 | Discharge: 2017-05-22 | Payer: MEDICARE

## 2017-05-28 ENCOUNTER — Inpatient Hospital Stay: Admit: 2017-05-20 | Discharge: 2017-05-21 | Payer: MEDICARE

## 2017-05-28 ENCOUNTER — Inpatient Hospital Stay
Admit: 2017-05-19 | Discharge: 2017-05-28 | Disposition: A | Discharge status: Home / Self Care | Payer: MEDICARE | Source: Other Acute Inpatient Hospital | Attending: Surgical Critical Care | Admitting: Surgical Critical Care

## 2017-05-28 ENCOUNTER — Encounter: Admit: 2017-05-28 | Discharge: 2017-05-28 | Payer: MEDICARE

## 2017-05-28 ENCOUNTER — Inpatient Hospital Stay: Admit: 2017-05-20 | Discharge: 2017-05-20 | Payer: MEDICARE

## 2017-05-28 ENCOUNTER — Inpatient Hospital Stay: Admit: 2017-05-21 | Discharge: 2017-05-21 | Payer: MEDICARE

## 2017-05-28 ENCOUNTER — Inpatient Hospital Stay: Admit: 2017-05-26 | Discharge: 2017-05-26 | Payer: MEDICARE

## 2017-05-28 ENCOUNTER — Encounter: Admit: 2017-05-18 | Discharge: 2017-05-18 | Payer: MEDICARE

## 2017-05-28 DIAGNOSIS — E785 Hyperlipidemia, unspecified: ICD-10-CM

## 2017-05-28 DIAGNOSIS — Z8551 Personal history of malignant neoplasm of bladder: ICD-10-CM

## 2017-05-28 DIAGNOSIS — E559 Vitamin D deficiency, unspecified: ICD-10-CM

## 2017-05-28 DIAGNOSIS — F329 Major depressive disorder, single episode, unspecified: ICD-10-CM

## 2017-05-28 DIAGNOSIS — G479 Sleep disorder, unspecified: ICD-10-CM

## 2017-05-28 DIAGNOSIS — S42322B Displaced transverse fracture of shaft of humerus, left arm, initial encounter for open fracture: ICD-10-CM

## 2017-05-28 DIAGNOSIS — F17210 Nicotine dependence, cigarettes, uncomplicated: ICD-10-CM

## 2017-05-28 DIAGNOSIS — D62 Acute posthemorrhagic anemia: ICD-10-CM

## 2017-05-28 DIAGNOSIS — D509 Iron deficiency anemia, unspecified: ICD-10-CM

## 2017-05-28 DIAGNOSIS — I959 Hypotension, unspecified: ICD-10-CM

## 2017-05-28 DIAGNOSIS — R739 Hyperglycemia, unspecified: ICD-10-CM

## 2017-05-28 DIAGNOSIS — R55 Syncope and collapse: ICD-10-CM

## 2017-05-28 DIAGNOSIS — F419 Anxiety disorder, unspecified: ICD-10-CM

## 2017-05-28 DIAGNOSIS — G8911 Acute pain due to trauma: ICD-10-CM

## 2017-05-28 DIAGNOSIS — Z8546 Personal history of malignant neoplasm of prostate: ICD-10-CM

## 2017-05-28 DIAGNOSIS — S5402XA Injury of ulnar nerve at forearm level, left arm, initial encounter: ICD-10-CM

## 2017-05-28 DIAGNOSIS — S46312A Strain of muscle, fascia and tendon of triceps, left arm, initial encounter: ICD-10-CM

## 2017-05-28 DIAGNOSIS — S42412B Displaced simple supracondylar fracture without intercondylar fracture of left humerus, initial encounter for open fracture: Principal | ICD-10-CM

## 2017-05-28 DIAGNOSIS — I10 Essential (primary) hypertension: ICD-10-CM

## 2017-05-28 DIAGNOSIS — Z7409 Other reduced mobility: ICD-10-CM

## 2017-05-28 DIAGNOSIS — E869 Volume depletion, unspecified: ICD-10-CM

## 2017-05-28 DIAGNOSIS — I96 Gangrene, not elsewhere classified: ICD-10-CM

## 2017-05-28 DIAGNOSIS — D72829 Elevated white blood cell count, unspecified: ICD-10-CM

## 2017-05-28 DIAGNOSIS — E872 Acidosis: ICD-10-CM

## 2017-05-28 DIAGNOSIS — Z79899 Other long term (current) drug therapy: ICD-10-CM

## 2017-05-28 DIAGNOSIS — C61 Malignant neoplasm of prostate: ICD-10-CM

## 2017-05-28 LAB — CBC
Lab: 10 g/dL — ABNORMAL LOW (ref 13.5–16.5)
Lab: 15 % — ABNORMAL HIGH (ref 11–15)
Lab: 29 % — ABNORMAL LOW (ref 40–50)
Lab: 3.1 M/UL — ABNORMAL LOW (ref 4.4–5.5)
Lab: 32 pg (ref 26–34)
Lab: 34 g/dL (ref 32.0–36.0)
Lab: 417 10*3/uL — ABNORMAL HIGH (ref 150–400)
Lab: 6.7 FL — ABNORMAL LOW (ref 7–11)
Lab: 9.3 10*3/uL (ref 4.5–11.0)
Lab: 93 FL (ref 80–100)

## 2017-05-28 LAB — POC GLUCOSE
Lab: 104 mg/dL — ABNORMAL HIGH (ref 70–100)
Lab: 112 mg/dL — ABNORMAL HIGH (ref 70–100)

## 2017-05-28 MED ORDER — GABAPENTIN 300 MG PO CAP
300 mg | ORAL_CAPSULE | ORAL | 0 refills | Status: AC
Start: 2017-05-28 — End: 2017-07-15

## 2017-05-28 MED ORDER — ACETAMINOPHEN 325 MG PO TAB
650 mg | ORAL | 0 refills | Status: AC
Start: 2017-05-28 — End: 2017-06-25

## 2017-05-28 MED ORDER — ERGOCALCIFEROL (VITAMIN D2) 50,000 UNIT PO CAP
1 | ORAL_CAPSULE | ORAL | 0 refills | Status: SS
Start: 2017-05-28 — End: 2017-08-26

## 2017-05-28 MED ORDER — MELATONIN 5 MG PO TAB
5 mg | Freq: Every evening | ORAL | 0 refills | 28.00000 days | Status: AC
Start: 2017-05-28 — End: ?

## 2017-05-28 MED ORDER — OXYCODONE 5 MG PO TAB
5-10 mg | ORAL_TABLET | ORAL | 0 refills | 6.00000 days | Status: AC | PRN
Start: 2017-05-28 — End: 2017-06-08

## 2017-05-28 MED ORDER — POLYETHYLENE GLYCOL 3350 17 GRAM PO PWPK
17 g | Freq: Every day | ORAL | 0 refills | 22.00000 days | Status: AC
Start: 2017-05-28 — End: 2017-06-25

## 2017-05-28 MED ORDER — METHOCARBAMOL 750 MG PO TAB
750 mg | ORAL_TABLET | Freq: Three times a day (TID) | ORAL | 0 refills | Status: AC | PRN
Start: 2017-05-28 — End: 2017-06-25

## 2017-05-28 MED ORDER — TRAZODONE 50 MG PO TAB
50 mg | ORAL_TABLET | Freq: Every evening | ORAL | 0 refills | Status: AC | PRN
Start: 2017-05-28 — End: 2017-07-15

## 2017-05-28 MED ORDER — SENNOSIDES 8.6 MG PO TAB
1 | ORAL_TABLET | Freq: Two times a day (BID) | ORAL | 3 refills | Status: AC
Start: 2017-05-28 — End: 2017-06-25

## 2017-06-05 ENCOUNTER — Encounter: Admit: 2017-06-05 | Discharge: 2017-06-05 | Payer: MEDICARE

## 2017-06-05 DIAGNOSIS — S42302B Unspecified fracture of shaft of humerus, left arm, initial encounter for open fracture: Principal | ICD-10-CM

## 2017-06-08 ENCOUNTER — Ambulatory Visit: Admit: 2017-06-08 | Discharge: 2017-06-08 | Payer: MEDICARE

## 2017-06-08 ENCOUNTER — Encounter: Admit: 2017-06-08 | Discharge: 2017-06-08 | Payer: MEDICARE

## 2017-06-08 DIAGNOSIS — I10 Essential (primary) hypertension: Principal | ICD-10-CM

## 2017-06-08 DIAGNOSIS — S42302D Unspecified fracture of shaft of humerus, left arm, subsequent encounter for fracture with routine healing: Principal | ICD-10-CM

## 2017-06-08 DIAGNOSIS — S42302B Unspecified fracture of shaft of humerus, left arm, initial encounter for open fracture: Principal | ICD-10-CM

## 2017-06-08 DIAGNOSIS — C61 Malignant neoplasm of prostate: ICD-10-CM

## 2017-06-08 MED ORDER — OXYCODONE 5 MG PO TAB
5-10 mg | ORAL_TABLET | ORAL | 0 refills | 6.00000 days | Status: AC | PRN
Start: 2017-06-08 — End: 2017-07-27

## 2017-06-11 ENCOUNTER — Encounter: Admit: 2017-06-11 | Discharge: 2017-06-11 | Payer: MEDICARE

## 2017-06-11 ENCOUNTER — Encounter: Admit: 2017-06-11 | Discharge: 2017-07-10 | Payer: MEDICARE

## 2017-06-11 ENCOUNTER — Ambulatory Visit: Admit: 2017-06-11 | Discharge: 2017-06-11 | Payer: MEDICARE

## 2017-06-11 DIAGNOSIS — C61 Malignant neoplasm of prostate: ICD-10-CM

## 2017-06-11 DIAGNOSIS — S42302D Unspecified fracture of shaft of humerus, left arm, subsequent encounter for fracture with routine healing: Principal | ICD-10-CM

## 2017-06-11 DIAGNOSIS — I10 Essential (primary) hypertension: Principal | ICD-10-CM

## 2017-06-24 ENCOUNTER — Encounter: Admit: 2017-06-24 | Discharge: 2017-06-24 | Payer: MEDICARE

## 2017-06-24 DIAGNOSIS — S42302D Unspecified fracture of shaft of humerus, left arm, subsequent encounter for fracture with routine healing: Principal | ICD-10-CM

## 2017-06-25 ENCOUNTER — Encounter: Admit: 2017-06-25 | Discharge: 2017-06-25 | Payer: MEDICARE

## 2017-06-25 ENCOUNTER — Ambulatory Visit: Admit: 2017-06-25 | Discharge: 2017-06-25 | Payer: MEDICARE

## 2017-06-25 ENCOUNTER — Ambulatory Visit: Admit: 2017-06-25 | Discharge: 2017-06-26 | Payer: MEDICARE

## 2017-06-25 DIAGNOSIS — S42302D Unspecified fracture of shaft of humerus, left arm, subsequent encounter for fracture with routine healing: Principal | ICD-10-CM

## 2017-06-25 DIAGNOSIS — C61 Malignant neoplasm of prostate: ICD-10-CM

## 2017-06-25 DIAGNOSIS — I10 Essential (primary) hypertension: Principal | ICD-10-CM

## 2017-07-10 DIAGNOSIS — S42302D Unspecified fracture of shaft of humerus, left arm, subsequent encounter for fracture with routine healing: Principal | ICD-10-CM

## 2017-07-13 ENCOUNTER — Encounter: Admit: 2017-07-13 | Discharge: 2017-07-13 | Payer: MEDICARE

## 2017-07-15 ENCOUNTER — Encounter: Admit: 2017-07-15 | Discharge: 2017-07-15 | Payer: MEDICARE

## 2017-07-15 MED ORDER — GABAPENTIN 300 MG PO CAP
300 mg | ORAL_CAPSULE | ORAL | 0 refills | Status: AC
Start: 2017-07-15 — End: 2017-09-08

## 2017-07-15 MED ORDER — TRAZODONE 50 MG PO TAB
50 mg | ORAL_TABLET | Freq: Every evening | ORAL | 0 refills | Status: AC | PRN
Start: 2017-07-15 — End: 2017-09-22

## 2017-07-15 MED ORDER — HYDROCODONE-ACETAMINOPHEN 5-325 MG PO TAB
1-2 | ORAL_TABLET | ORAL | 0 refills | 15.00000 days | Status: AC | PRN
Start: 2017-07-15 — End: 2017-08-26

## 2017-07-27 ENCOUNTER — Ambulatory Visit: Admit: 2017-07-27 | Discharge: 2017-07-27 | Payer: MEDICARE

## 2017-07-27 ENCOUNTER — Encounter: Admit: 2017-07-27 | Discharge: 2017-07-27 | Payer: MEDICARE

## 2017-07-27 DIAGNOSIS — S46002A Unspecified injury of muscle(s) and tendon(s) of the rotator cuff of left shoulder, initial encounter: Secondary | ICD-10-CM

## 2017-07-27 DIAGNOSIS — C61 Malignant neoplasm of prostate: ICD-10-CM

## 2017-07-27 DIAGNOSIS — S42302D Unspecified fracture of shaft of humerus, left arm, subsequent encounter for fracture with routine healing: Principal | ICD-10-CM

## 2017-07-27 DIAGNOSIS — I10 Essential (primary) hypertension: Principal | ICD-10-CM

## 2017-08-05 ENCOUNTER — Encounter: Admit: 2017-08-05 | Discharge: 2017-08-06 | Payer: MEDICARE

## 2017-08-05 DIAGNOSIS — R69 Illness, unspecified: Principal | ICD-10-CM

## 2017-08-19 ENCOUNTER — Encounter: Admit: 2017-08-19 | Discharge: 2017-08-20 | Payer: MEDICARE

## 2017-08-19 ENCOUNTER — Encounter: Admit: 2017-08-19 | Discharge: 2017-08-19 | Payer: MEDICARE

## 2017-08-19 DIAGNOSIS — R69 Illness, unspecified: Principal | ICD-10-CM

## 2017-08-20 ENCOUNTER — Ambulatory Visit: Admit: 2017-08-20 | Discharge: 2017-08-20 | Payer: MEDICARE

## 2017-08-20 ENCOUNTER — Encounter: Admit: 2017-08-20 | Discharge: 2017-08-20 | Payer: MEDICARE

## 2017-08-20 DIAGNOSIS — T8484XA Pain due to internal orthopedic prosthetic devices, implants and grafts, initial encounter: ICD-10-CM

## 2017-08-20 DIAGNOSIS — S42322K Displaced transverse fracture of shaft of humerus, left arm, subsequent encounter for fracture with nonunion: Principal | ICD-10-CM

## 2017-08-20 DIAGNOSIS — C61 Malignant neoplasm of prostate: ICD-10-CM

## 2017-08-20 DIAGNOSIS — S42352K Displaced comminuted fracture of shaft of humerus, left arm, subsequent encounter for fracture with nonunion: Principal | ICD-10-CM

## 2017-08-20 DIAGNOSIS — Z0389 Encounter for observation for other suspected diseases and conditions ruled out: ICD-10-CM

## 2017-08-20 DIAGNOSIS — I10 Essential (primary) hypertension: Principal | ICD-10-CM

## 2017-08-20 DIAGNOSIS — Z720 Tobacco use: ICD-10-CM

## 2017-08-20 DIAGNOSIS — S42309K Unspecified fracture of shaft of humerus, unspecified arm, subsequent encounter for fracture with nonunion: ICD-10-CM

## 2017-08-20 LAB — CBC AND DIFF
Lab: 0 % (ref 0–2)
Lab: 0 10*3/uL (ref 0–0.20)
Lab: 0.2 10*3/uL (ref 0–0.45)
Lab: 0.7 10*3/uL (ref 0–0.80)
Lab: 1.5 10*3/uL (ref 1.0–4.8)
Lab: 10 % (ref 4–12)
Lab: 13 % (ref 11–15)
Lab: 13 g/dL (ref 13.5–16.5)
Lab: 2 % (ref 0–5)
Lab: 20 % — ABNORMAL LOW (ref 24–44)
Lab: 284 10*3/uL (ref 150–400)
Lab: 31 pg (ref 26–34)
Lab: 34 g/dL (ref 32.0–36.0)
Lab: 4.3 M/UL — ABNORMAL LOW (ref 4.4–5.5)
Lab: 40 % (ref 40–50)
Lab: 5 10*3/uL (ref 1.8–7.0)
Lab: 6.9 FL — ABNORMAL LOW (ref 7–11)
Lab: 68 % (ref 41–77)
Lab: 7.5 10*3/uL (ref 4.5–11.0)
Lab: 93 FL (ref 80–100)

## 2017-08-20 LAB — SED RATE: Lab: 12 mm/h (ref 0–20)

## 2017-08-20 LAB — C REACTIVE PROTEIN (CRP): Lab: 0.7 mg/dL — ABNORMAL HIGH (ref <1.0)

## 2017-08-20 MED ORDER — HYDROCODONE-ACETAMINOPHEN 5-325 MG PO TAB
1-2 | ORAL_TABLET | ORAL | 0 refills | 30.00000 days | Status: AC | PRN
Start: 2017-08-20 — End: 2017-08-26

## 2017-08-24 DIAGNOSIS — S42322K Displaced transverse fracture of shaft of humerus, left arm, subsequent encounter for fracture with nonunion: Principal | ICD-10-CM

## 2017-08-25 ENCOUNTER — Ambulatory Visit: Admit: 2017-08-25 | Discharge: 2017-08-26 | Payer: MEDICARE

## 2017-08-25 ENCOUNTER — Encounter: Admit: 2017-08-25 | Discharge: 2017-08-25 | Payer: MEDICARE

## 2017-08-25 ENCOUNTER — Inpatient Hospital Stay: Admit: 2017-08-25 | Discharge: 2017-08-26 | Disposition: A | Discharge status: Home / Self Care | Payer: MEDICARE

## 2017-08-25 DIAGNOSIS — I10 Essential (primary) hypertension: Principal | ICD-10-CM

## 2017-08-25 DIAGNOSIS — C61 Malignant neoplasm of prostate: ICD-10-CM

## 2017-08-25 MED ORDER — GABAPENTIN 300 MG PO CAP
300 mg | Freq: Once | ORAL | 0 refills | Status: CP
Start: 2017-08-25 — End: ?
  Administered 2017-08-25: 22:00:00 300 mg via ORAL

## 2017-08-25 MED ORDER — LACTATED RINGERS IV SOLP
1000 mL | INTRAVENOUS | 0 refills | Status: DC
Start: 2017-08-25 — End: 2017-08-26

## 2017-08-25 MED ORDER — CITALOPRAM 20 MG PO TAB
20 mg | Freq: Every day | ORAL | 0 refills | Status: DC
Start: 2017-08-25 — End: 2017-08-26
  Administered 2017-08-26 (×2): 20 mg via ORAL

## 2017-08-25 MED ORDER — DIPHENHYDRAMINE HCL 25 MG PO CAP
25 mg | ORAL | 0 refills | Status: DC | PRN
Start: 2017-08-25 — End: 2017-08-26

## 2017-08-25 MED ORDER — MIDAZOLAM 1 MG/ML IJ SOLN
INTRAVENOUS | 0 refills | Status: DC
Start: 2017-08-25 — End: 2017-08-25
  Administered 2017-08-25: 14:00:00 2 mg via INTRAVENOUS

## 2017-08-25 MED ORDER — SIMVASTATIN 40 MG PO TAB
80 mg | Freq: Every evening | ORAL | 0 refills | Status: DC
Start: 2017-08-25 — End: 2017-08-26
  Administered 2017-08-26: 02:00:00 80 mg via ORAL

## 2017-08-25 MED ORDER — DIPHENHYDRAMINE HCL 50 MG/ML IJ SOLN
25 mg | INTRAVENOUS | 0 refills | Status: DC | PRN
Start: 2017-08-25 — End: 2017-08-26

## 2017-08-25 MED ORDER — SUGAMMADEX 100 MG/ML IV SOLN
INTRAVENOUS | 0 refills | Status: DC
Start: 2017-08-25 — End: 2017-08-25
  Administered 2017-08-25: 20:00:00 170 mg via INTRAVENOUS

## 2017-08-25 MED ORDER — DEXTRAN 70-HYPROMELLOSE (PF) 0.1-0.3 % OP DPET
0 refills | Status: DC
Start: 2017-08-25 — End: 2017-08-25
  Administered 2017-08-25: 14:00:00 2 [drp] via OPHTHALMIC

## 2017-08-25 MED ORDER — ONDANSETRON HCL (PF) 4 MG/2 ML IJ SOLN
INTRAVENOUS | 0 refills | Status: DC
Start: 2017-08-25 — End: 2017-08-25
  Administered 2017-08-25: 19:00:00 4 mg via INTRAVENOUS

## 2017-08-25 MED ORDER — CEFAZOLIN 1 GRAM IJ SOLR
0 refills | Status: DC
Start: 2017-08-25 — End: 2017-08-25
  Administered 2017-08-25 (×2): 2 g via INTRAVENOUS

## 2017-08-25 MED ORDER — ENOXAPARIN 40 MG/0.4 ML SC SYRG
40 mg | Freq: Every day | SUBCUTANEOUS | 0 refills | Status: DC
Start: 2017-08-25 — End: 2017-08-26
  Administered 2017-08-26: 02:00:00 40 mg via SUBCUTANEOUS

## 2017-08-25 MED ORDER — BISACODYL 10 MG RE SUPP
10 mg | Freq: Every day | RECTAL | 0 refills | Status: DC | PRN
Start: 2017-08-25 — End: 2017-08-26

## 2017-08-25 MED ORDER — ACETAMINOPHEN 500 MG PO TAB
1000 mg | Freq: Once | ORAL | 0 refills | Status: CP
Start: 2017-08-25 — End: ?
  Administered 2017-08-25: 13:00:00 1000 mg via ORAL

## 2017-08-25 MED ORDER — ROCURONIUM 10 MG/ML IV SOLN
INTRAVENOUS | 0 refills | Status: DC
Start: 2017-08-25 — End: 2017-08-25
  Administered 2017-08-25: 14:00:00 50 mg via INTRAVENOUS
  Administered 2017-08-25 (×5): 10 mg via INTRAVENOUS

## 2017-08-25 MED ORDER — HALOPERIDOL LACTATE 5 MG/ML IJ SOLN
1 mg | Freq: Once | INTRAVENOUS | 0 refills | Status: DC | PRN
Start: 2017-08-25 — End: 2017-08-26

## 2017-08-25 MED ORDER — KETAMINE 10 MG/ML IJ SOLN
0 refills | Status: DC
Start: 2017-08-25 — End: 2017-08-25
  Administered 2017-08-25: 14:00:00 20 mg via INTRAVENOUS
  Administered 2017-08-25: 16:00:00 10 mg via INTRAVENOUS

## 2017-08-25 MED ORDER — MELATONIN 5 MG PO TAB
5 mg | Freq: Every evening | ORAL | 0 refills | Status: DC
Start: 2017-08-25 — End: 2017-08-26
  Administered 2017-08-26: 02:00:00 5 mg via ORAL

## 2017-08-25 MED ORDER — OXYCODONE 5 MG PO TAB
5-10 mg | ORAL | 0 refills | Status: DC | PRN
Start: 2017-08-25 — End: 2017-08-26
  Administered 2017-08-26 (×5): 10 mg via ORAL

## 2017-08-25 MED ORDER — HYDROMORPHONE (PF) 2 MG/ML IJ SYRG
.5 mg | INTRAVENOUS | 0 refills | Status: DC | PRN
Start: 2017-08-25 — End: 2017-08-26
  Administered 2017-08-25 (×2): 0.5 mg via INTRAVENOUS

## 2017-08-25 MED ORDER — EPHEDRINE SULFATE 50 MG/5ML SYR (10 MG/ML) (AN)(OSM)
0 refills | Status: DC
Start: 2017-08-25 — End: 2017-08-25
  Administered 2017-08-25 (×2): 10 mg via INTRAVENOUS

## 2017-08-25 MED ORDER — ACETAMINOPHEN 500 MG PO TAB
1000 mg | Freq: Once | ORAL | 0 refills | Status: CP
Start: 2017-08-25 — End: ?
  Administered 2017-08-25: 22:00:00 1000 mg via ORAL

## 2017-08-25 MED ORDER — PHENYLEPHRINE IV DRIP (STD CONC)
0 refills | Status: DC
Start: 2017-08-25 — End: 2017-08-25
  Administered 2017-08-25 (×2): 0.4 ug/kg/min via INTRAVENOUS

## 2017-08-25 MED ORDER — FENTANYL CITRATE (PF) 50 MCG/ML IJ SOLN
25 ug | INTRAVENOUS | 0 refills | Status: DC | PRN
Start: 2017-08-25 — End: 2017-08-26
  Administered 2017-08-25 (×4): 25 ug via INTRAVENOUS

## 2017-08-25 MED ORDER — PHENYLEPHRINE IN 0.9% NACL(PF) 1 MG/10 ML (100 MCG/ML) IV SYRG
INTRAVENOUS | 0 refills | Status: DC
Start: 2017-08-25 — End: 2017-08-25
  Administered 2017-08-25 (×9): 100 ug via INTRAVENOUS

## 2017-08-25 MED ORDER — MAGNESIUM HYDROXIDE 2,400 MG/10 ML PO SUSP
10 mL | Freq: Every day | ORAL | 0 refills | Status: DC
Start: 2017-08-25 — End: 2017-08-26

## 2017-08-25 MED ORDER — ONDANSETRON HCL (PF) 4 MG/2 ML IJ SOLN
4 mg | INTRAVENOUS | 0 refills | Status: DC | PRN
Start: 2017-08-25 — End: 2017-08-26

## 2017-08-25 MED ORDER — LACTATED RINGERS IV SOLP
1000 mL | INTRAVENOUS | 0 refills | Status: DC
Start: 2017-08-25 — End: 2017-08-26
  Administered 2017-08-25: 17:00:00 1000.000 mL via INTRAVENOUS
  Administered 2017-08-25 (×2): 1000 mL via INTRAVENOUS

## 2017-08-25 MED ORDER — FENTANYL CITRATE (PF) 50 MCG/ML IJ SOLN
0 refills | Status: DC
Start: 2017-08-25 — End: 2017-08-25
  Administered 2017-08-25: 14:00:00 100 ug via INTRAVENOUS

## 2017-08-25 MED ORDER — OXYCODONE 5 MG PO TAB
5 mg | Freq: Once | ORAL | 0 refills | Status: CP
Start: 2017-08-25 — End: ?
  Administered 2017-08-25: 13:00:00 5 mg via ORAL

## 2017-08-25 MED ORDER — PROPOFOL INJ 10 MG/ML IV VIAL
0 refills | Status: DC
Start: 2017-08-25 — End: 2017-08-25
  Administered 2017-08-25: 17:00:00 20 mg via INTRAVENOUS
  Administered 2017-08-25: 14:00:00 180 mg via INTRAVENOUS

## 2017-08-25 MED ORDER — ERGOCALCIFEROL (VITAMIN D2) 50,000 UNIT PO CAP
50000 [IU] | ORAL | 0 refills | Status: DC
Start: 2017-08-25 — End: 2017-08-26

## 2017-08-25 MED ORDER — ACETAMINOPHEN 650 MG RE SUPP
650 mg | RECTAL | 0 refills | Status: DC | PRN
Start: 2017-08-25 — End: 2017-08-26

## 2017-08-25 MED ORDER — GABAPENTIN 300 MG PO CAP
300 mg | Freq: Once | ORAL | 0 refills | Status: CP
Start: 2017-08-25 — End: ?
  Administered 2017-08-25: 13:00:00 300 mg via ORAL

## 2017-08-25 MED ORDER — DEXAMETHASONE SODIUM PHOSPHATE 4 MG/ML IJ SOLN
INTRAVENOUS | 0 refills | Status: DC
Start: 2017-08-25 — End: 2017-08-25
  Administered 2017-08-25: 15:00:00 4 mg via INTRAVENOUS

## 2017-08-25 MED ORDER — OXYCODONE 5 MG PO TAB
5-10 mg | Freq: Once | ORAL | 0 refills | Status: CP | PRN
Start: 2017-08-25 — End: ?
  Administered 2017-08-25: 21:00:00 10 mg via ORAL

## 2017-08-25 MED ORDER — TRAZODONE 50 MG PO TAB
50 mg | Freq: Every evening | ORAL | 0 refills | Status: DC | PRN
Start: 2017-08-25 — End: 2017-08-26

## 2017-08-25 MED ORDER — DOCUSATE SODIUM 100 MG PO CAP
100 mg | Freq: Two times a day (BID) | ORAL | 0 refills | Status: DC
Start: 2017-08-25 — End: 2017-08-26

## 2017-08-25 MED ORDER — HYDROMORPHONE (PF) 2 MG/ML IJ SYRG
0 refills | Status: DC
Start: 2017-08-25 — End: 2017-08-25
  Administered 2017-08-25 (×3): 0.5 mg via INTRAVENOUS

## 2017-08-25 MED ORDER — GABAPENTIN 300 MG PO CAP
300 mg | ORAL | 0 refills | Status: DC
Start: 2017-08-25 — End: 2017-08-26
  Administered 2017-08-26 (×3): 300 mg via ORAL

## 2017-08-25 MED ORDER — CEFAZOLIN INJ 1GM IVP
2 g | INTRAVENOUS | 0 refills | Status: CP
Start: 2017-08-25 — End: ?
  Administered 2017-08-26 (×2): 2 g via INTRAVENOUS

## 2017-08-25 MED ORDER — ACETAMINOPHEN 325 MG PO TAB
650 mg | ORAL | 0 refills | Status: DC | PRN
Start: 2017-08-25 — End: 2017-08-26
  Administered 2017-08-26: 15:00:00 650 mg via ORAL

## 2017-08-25 MED ORDER — LIDOCAINE (PF) 200 MG/10 ML (2 %) IJ SYRG
0 refills | Status: DC
Start: 2017-08-25 — End: 2017-08-25
  Administered 2017-08-25: 14:00:00 100 mg via INTRAVENOUS

## 2017-08-26 ENCOUNTER — Ambulatory Visit: Admit: 2017-08-25 | Discharge: 2017-08-25 | Payer: MEDICARE

## 2017-08-26 ENCOUNTER — Encounter: Admit: 2017-08-26 | Discharge: 2017-08-26 | Payer: MEDICARE

## 2017-08-26 ENCOUNTER — Ambulatory Visit: Admit: 2017-08-24 | Discharge: 2017-08-24 | Payer: MEDICARE

## 2017-08-26 DIAGNOSIS — Z8546 Personal history of malignant neoplasm of prostate: ICD-10-CM

## 2017-08-26 DIAGNOSIS — Z923 Personal history of irradiation: ICD-10-CM

## 2017-08-26 DIAGNOSIS — N35812 Other urethral bulbous stricture, male: ICD-10-CM

## 2017-08-26 DIAGNOSIS — T84111A Breakdown (mechanical) of internal fixation device of left humerus, initial encounter: ICD-10-CM

## 2017-08-26 DIAGNOSIS — I10 Essential (primary) hypertension: ICD-10-CM

## 2017-08-26 DIAGNOSIS — S4422XA Injury of radial nerve at upper arm level, left arm, initial encounter: ICD-10-CM

## 2017-08-26 DIAGNOSIS — C61 Malignant neoplasm of prostate: ICD-10-CM

## 2017-08-26 DIAGNOSIS — S42322K Displaced transverse fracture of shaft of humerus, left arm, subsequent encounter for fracture with nonunion: Principal | ICD-10-CM

## 2017-08-26 LAB — CBC
Lab: 3.8 M/UL — ABNORMAL LOW (ref 4.4–5.5)
Lab: 9.6 K/UL — ABNORMAL LOW (ref >60)

## 2017-08-26 LAB — BASIC METABOLIC PANEL: Lab: 137 MMOL/L — ABNORMAL LOW (ref 137–147)

## 2017-08-26 MED ORDER — DOCUSATE SODIUM 100 MG PO CAP
100 mg | ORAL_CAPSULE | Freq: Two times a day (BID) | ORAL | 3 refills | Status: AC
Start: 2017-08-26 — End: 2017-10-22

## 2017-08-26 MED ORDER — OXYCODONE 5 MG PO TAB
5-10 mg | ORAL_TABLET | ORAL | 0 refills | 6.00000 days | Status: AC | PRN
Start: 2017-08-26 — End: 2017-10-22

## 2017-08-26 MED ORDER — ERGOCALCIFEROL (VITAMIN D2) 50,000 UNIT PO CAP
1 | ORAL_CAPSULE | ORAL | 0 refills | 56.00000 days | Status: AC
Start: 2017-08-26 — End: 2017-10-22

## 2017-08-26 MED ORDER — ACETAMINOPHEN 325 MG PO TAB
650 mg | ORAL | 0 refills | Status: AC | PRN
Start: 2017-08-26 — End: ?

## 2017-08-27 ENCOUNTER — Encounter: Admit: 2017-08-27 | Discharge: 2017-08-27 | Payer: MEDICARE

## 2017-08-27 DIAGNOSIS — C61 Malignant neoplasm of prostate: ICD-10-CM

## 2017-08-27 DIAGNOSIS — I10 Essential (primary) hypertension: Principal | ICD-10-CM

## 2017-08-29 ENCOUNTER — Encounter: Admit: 2017-08-29 | Discharge: 2017-08-29 | Payer: MEDICARE

## 2017-09-08 ENCOUNTER — Encounter: Admit: 2017-09-08 | Discharge: 2017-09-08 | Payer: MEDICARE

## 2017-09-08 MED ORDER — GABAPENTIN 300 MG PO CAP
300 mg | ORAL_CAPSULE | ORAL | 1 refills | Status: AC
Start: 2017-09-08 — End: ?

## 2017-09-09 ENCOUNTER — Encounter: Admit: 2017-09-09 | Discharge: 2017-09-09 | Payer: MEDICARE

## 2017-09-09 DIAGNOSIS — S42352K Displaced comminuted fracture of shaft of humerus, left arm, subsequent encounter for fracture with nonunion: Principal | ICD-10-CM

## 2017-09-10 ENCOUNTER — Ambulatory Visit: Admit: 2017-09-10 | Discharge: 2017-09-10 | Payer: MEDICARE

## 2017-09-10 ENCOUNTER — Encounter: Admit: 2017-09-10 | Discharge: 2017-09-10 | Payer: MEDICARE

## 2017-09-10 DIAGNOSIS — S42352K Displaced comminuted fracture of shaft of humerus, left arm, subsequent encounter for fracture with nonunion: Principal | ICD-10-CM

## 2017-09-10 DIAGNOSIS — I10 Essential (primary) hypertension: Principal | ICD-10-CM

## 2017-09-10 DIAGNOSIS — C61 Malignant neoplasm of prostate: ICD-10-CM

## 2017-09-10 DIAGNOSIS — S42302K Unspecified fracture of shaft of humerus, left arm, subsequent encounter for fracture with nonunion: ICD-10-CM

## 2017-09-22 ENCOUNTER — Encounter: Admit: 2017-09-22 | Discharge: 2017-09-22 | Payer: MEDICARE

## 2017-09-22 MED ORDER — TRAZODONE 50 MG PO TAB
50 mg | ORAL_TABLET | Freq: Every evening | ORAL | 0 refills | Status: AC | PRN
Start: 2017-09-22 — End: ?

## 2017-10-19 ENCOUNTER — Encounter: Admit: 2017-10-19 | Discharge: 2017-10-19 | Payer: MEDICARE

## 2017-10-19 DIAGNOSIS — S42302K Unspecified fracture of shaft of humerus, left arm, subsequent encounter for fracture with nonunion: Principal | ICD-10-CM

## 2017-10-22 ENCOUNTER — Ambulatory Visit: Admit: 2017-10-22 | Discharge: 2017-10-22 | Payer: MEDICARE

## 2017-10-22 ENCOUNTER — Encounter: Admit: 2017-10-22 | Discharge: 2017-10-22 | Payer: MEDICARE

## 2017-10-22 DIAGNOSIS — C61 Malignant neoplasm of prostate: ICD-10-CM

## 2017-10-22 DIAGNOSIS — I10 Essential (primary) hypertension: Principal | ICD-10-CM

## 2017-10-22 DIAGNOSIS — S42302K Unspecified fracture of shaft of humerus, left arm, subsequent encounter for fracture with nonunion: Principal | ICD-10-CM

## 2017-11-04 ENCOUNTER — Encounter: Admit: 2017-11-04 | Discharge: 2017-11-04 | Payer: MEDICARE

## 2017-11-04 DIAGNOSIS — R69 Illness, unspecified: Principal | ICD-10-CM

## 2017-12-22 ENCOUNTER — Encounter: Admit: 2017-12-22 | Discharge: 2017-12-22 | Payer: MEDICARE

## 2017-12-22 DIAGNOSIS — S42302K Unspecified fracture of shaft of humerus, left arm, subsequent encounter for fracture with nonunion: Principal | ICD-10-CM

## 2017-12-24 ENCOUNTER — Encounter: Admit: 2017-12-24 | Discharge: 2017-12-24 | Payer: MEDICARE

## 2017-12-24 ENCOUNTER — Ambulatory Visit: Admit: 2017-12-24 | Discharge: 2017-12-24 | Payer: MEDICARE

## 2017-12-24 DIAGNOSIS — C61 Malignant neoplasm of prostate: ICD-10-CM

## 2017-12-24 DIAGNOSIS — S42302K Unspecified fracture of shaft of humerus, left arm, subsequent encounter for fracture with nonunion: Principal | ICD-10-CM

## 2017-12-24 DIAGNOSIS — I10 Essential (primary) hypertension: Principal | ICD-10-CM

## 2018-02-01 ENCOUNTER — Encounter: Admit: 2018-02-01 | Discharge: 2018-02-01 | Payer: MEDICARE

## 2018-02-01 ENCOUNTER — Ambulatory Visit: Admit: 2018-02-23 | Discharge: 2018-02-23 | Payer: MEDICARE

## 2018-02-01 ENCOUNTER — Ambulatory Visit: Admit: 2018-02-01 | Discharge: 2018-02-01 | Payer: MEDICARE

## 2018-02-01 DIAGNOSIS — S42322K Displaced transverse fracture of shaft of humerus, left arm, subsequent encounter for fracture with nonunion: Principal | ICD-10-CM

## 2018-02-01 DIAGNOSIS — S42302K Unspecified fracture of shaft of humerus, left arm, subsequent encounter for fracture with nonunion: Principal | ICD-10-CM

## 2018-02-01 DIAGNOSIS — I10 Essential (primary) hypertension: Principal | ICD-10-CM

## 2018-02-01 DIAGNOSIS — C61 Malignant neoplasm of prostate: ICD-10-CM

## 2018-02-01 DIAGNOSIS — S42352K Displaced comminuted fracture of shaft of humerus, left arm, subsequent encounter for fracture with nonunion: Principal | ICD-10-CM

## 2018-02-22 ENCOUNTER — Encounter: Admit: 2018-02-22 | Discharge: 2018-02-22 | Payer: MEDICARE

## 2018-02-22 DIAGNOSIS — I10 Essential (primary) hypertension: Secondary | ICD-10-CM

## 2018-02-22 DIAGNOSIS — C61 Malignant neoplasm of prostate: Secondary | ICD-10-CM

## 2018-02-23 ENCOUNTER — Ambulatory Visit: Admit: 2018-02-23 | Discharge: 2018-02-23 | Payer: MEDICARE

## 2018-02-23 ENCOUNTER — Encounter: Admit: 2018-02-23 | Discharge: 2018-02-23 | Payer: MEDICARE

## 2018-02-23 ENCOUNTER — Ambulatory Visit: Admit: 2018-02-22 | Discharge: 2018-02-22 | Payer: MEDICARE

## 2018-02-23 DIAGNOSIS — I10 Essential (primary) hypertension: ICD-10-CM

## 2018-02-23 DIAGNOSIS — F17210 Nicotine dependence, cigarettes, uncomplicated: Secondary | ICD-10-CM

## 2018-02-23 DIAGNOSIS — Z8546 Personal history of malignant neoplasm of prostate: Secondary | ICD-10-CM

## 2018-02-23 DIAGNOSIS — Z79899 Other long term (current) drug therapy: ICD-10-CM

## 2018-02-23 DIAGNOSIS — S42322K Displaced transverse fracture of shaft of humerus, left arm, subsequent encounter for fracture with nonunion: Secondary | ICD-10-CM

## 2018-02-23 DIAGNOSIS — Z9889 Other specified postprocedural states: Secondary | ICD-10-CM

## 2018-02-23 DIAGNOSIS — C61 Malignant neoplasm of prostate: Secondary | ICD-10-CM

## 2018-02-23 MED ORDER — SUGAMMADEX 100 MG/ML IV SOLN
INTRAVENOUS | 0 refills | Status: DC
Start: 2018-02-23 — End: 2018-02-23
  Administered 2018-02-23: 16:00:00 160 mg via INTRAVENOUS

## 2018-02-23 MED ORDER — MAGNESIUM HYDROXIDE 2,400 MG/10 ML PO SUSP
10 mL | Freq: Every day | ORAL | 0 refills | Status: CN
Start: 2018-02-23 — End: ?

## 2018-02-23 MED ORDER — EPHEDRINE SULFATE 50 MG/ML IJ SOLN
0 refills | Status: DC
Start: 2018-02-23 — End: 2018-02-23
  Administered 2018-02-23 (×2): 15 mg via INTRAVENOUS
  Administered 2018-02-23 (×2): 10 mg via INTRAVENOUS

## 2018-02-23 MED ORDER — ACETAMINOPHEN 325 MG PO TAB
650 mg | ORAL | 0 refills | Status: CN | PRN
Start: 2018-02-23 — End: ?

## 2018-02-23 MED ORDER — ACETAMINOPHEN 650 MG RE SUPP
650 mg | RECTAL | 0 refills | Status: CN | PRN
Start: 2018-02-23 — End: ?

## 2018-02-23 MED ORDER — FENTANYL CITRATE (PF) 50 MCG/ML IJ SOLN
25 ug | INTRAVENOUS | 0 refills | Status: CN | PRN
Start: 2018-02-23 — End: ?

## 2018-02-23 MED ORDER — GABAPENTIN 300 MG PO CAP
300 mg | Freq: Once | ORAL | 0 refills | Status: CP
Start: 2018-02-23 — End: ?
  Administered 2018-02-23: 13:00:00 300 mg via ORAL

## 2018-02-23 MED ORDER — CEFAZOLIN INJ 1GM IVP
1 g | INTRAVENOUS | 0 refills | Status: CN
Start: 2018-02-23 — End: ?

## 2018-02-23 MED ORDER — PROMETHAZINE 25 MG/ML IJ SOLN
6.25 mg | INTRAVENOUS | 0 refills | Status: DC | PRN
Start: 2018-02-23 — End: 2018-02-23

## 2018-02-23 MED ORDER — LIDOCAINE (PF) 200 MG/10 ML (2 %) IJ SYRG
0 refills | Status: DC
Start: 2018-02-23 — End: 2018-02-23
  Administered 2018-02-23: 14:00:00 100 mg via INTRAVENOUS

## 2018-02-23 MED ORDER — BISACODYL 10 MG RE SUPP
10 mg | Freq: Every day | RECTAL | 0 refills | Status: CN | PRN
Start: 2018-02-23 — End: ?

## 2018-02-23 MED ORDER — CITALOPRAM 20 MG PO TAB
20 mg | Freq: Every day | ORAL | 0 refills | Status: CN
Start: 2018-02-23 — End: ?

## 2018-02-23 MED ORDER — OXYCODONE 5 MG PO TAB
5-10 mg | ORAL | 0 refills | Status: CN | PRN
Start: 2018-02-23 — End: ?

## 2018-02-23 MED ORDER — DEXTRAN 70-HYPROMELLOSE (PF) 0.1-0.3 % OP DPET
0 refills | Status: DC
Start: 2018-02-23 — End: 2018-02-23
  Administered 2018-02-23: 14:00:00 2 [drp] via OPHTHALMIC

## 2018-02-23 MED ORDER — ACETAMINOPHEN 500 MG PO TAB
1000 mg | Freq: Once | ORAL | 0 refills | Status: CP
Start: 2018-02-23 — End: ?
  Administered 2018-02-23: 13:00:00 1000 mg via ORAL

## 2018-02-23 MED ORDER — SIMVASTATIN 20 MG PO TAB
80 mg | Freq: Every evening | ORAL | 0 refills | Status: CN
Start: 2018-02-23 — End: ?

## 2018-02-23 MED ORDER — DIPHENHYDRAMINE HCL 25 MG PO CAP
25 mg | ORAL | 0 refills | Status: CN | PRN
Start: 2018-02-23 — End: ?

## 2018-02-23 MED ORDER — KETAMINE 10 MG/ML IJ SOLN
0 refills | Status: DC
Start: 2018-02-23 — End: 2018-02-23
  Administered 2018-02-23: 15:00:00 20 mg via INTRAVENOUS
  Administered 2018-02-23: 15:00:00 10 mg via INTRAVENOUS

## 2018-02-23 MED ORDER — ONDANSETRON HCL (PF) 4 MG/2 ML IJ SOLN
INTRAVENOUS | 0 refills | Status: DC
Start: 2018-02-23 — End: 2018-02-23
  Administered 2018-02-23: 16:00:00 4 mg via INTRAVENOUS

## 2018-02-23 MED ORDER — MIDAZOLAM 1 MG/ML IJ SOLN
INTRAVENOUS | 0 refills | Status: DC
Start: 2018-02-23 — End: 2018-02-23
  Administered 2018-02-23: 14:00:00 1 mg via INTRAVENOUS

## 2018-02-23 MED ORDER — HALOPERIDOL LACTATE 5 MG/ML IJ SOLN
1 mg | Freq: Once | INTRAVENOUS | 0 refills | Status: DC | PRN
Start: 2018-02-23 — End: 2018-02-23

## 2018-02-23 MED ORDER — FENTANYL CITRATE (PF) 50 MCG/ML IJ SOLN
0 refills | Status: DC
Start: 2018-02-23 — End: 2018-02-23
  Administered 2018-02-23 (×2): 50 ug via INTRAVENOUS

## 2018-02-23 MED ORDER — LIDOCAINE (PF) 10 MG/ML (1 %) IJ SOLN
.1-2 mL | INTRAMUSCULAR | 0 refills | Status: DC | PRN
Start: 2018-02-23 — End: 2018-02-23

## 2018-02-23 MED ORDER — FENTANYL CITRATE (PF) 50 MCG/ML IJ SOLN
25 ug | INTRAVENOUS | 0 refills | Status: DC | PRN
Start: 2018-02-23 — End: 2018-02-23

## 2018-02-23 MED ORDER — ONDANSETRON HCL (PF) 4 MG/2 ML IJ SOLN
4 mg | INTRAVENOUS | 0 refills | Status: CN | PRN
Start: 2018-02-23 — End: ?

## 2018-02-23 MED ORDER — DEXAMETHASONE SODIUM PHOSPHATE 4 MG/ML IJ SOLN
INTRAVENOUS | 0 refills | Status: DC
Start: 2018-02-23 — End: 2018-02-23
  Administered 2018-02-23: 14:00:00 4 mg via INTRAVENOUS

## 2018-02-23 MED ORDER — CEFAZOLIN 1 GRAM IJ SOLR
0 refills | Status: DC
Start: 2018-02-23 — End: 2018-02-23
  Administered 2018-02-23: 14:00:00 2 g via INTRAVENOUS

## 2018-02-23 MED ORDER — OXYCODONE 5 MG PO TAB
5-10 mg | ORAL_TABLET | ORAL | 0 refills | 6.00000 days | Status: AC | PRN
Start: 2018-02-23 — End: 2019-09-20
  Filled 2018-02-23 (×2): qty 70, 6d supply, fill #1

## 2018-02-23 MED ORDER — DOCUSATE SODIUM 100 MG PO CAP
100 mg | Freq: Two times a day (BID) | ORAL | 0 refills | Status: CN
Start: 2018-02-23 — End: ?

## 2018-02-23 MED ORDER — OXYCODONE 5 MG PO TAB
5-10 mg | Freq: Once | ORAL | 0 refills | Status: CP | PRN
Start: 2018-02-23 — End: ?
  Administered 2018-02-23: 17:00:00 10 mg via ORAL

## 2018-02-23 MED ORDER — DIPHENHYDRAMINE HCL 50 MG/ML IJ SOLN
25 mg | INTRAVENOUS | 0 refills | Status: CN | PRN
Start: 2018-02-23 — End: ?

## 2018-02-23 MED ORDER — FENTANYL CITRATE (PF) 50 MCG/ML IJ SOLN
50 ug | INTRAVENOUS | 0 refills | Status: DC | PRN
Start: 2018-02-23 — End: 2018-02-23

## 2018-02-23 MED ORDER — METOCLOPRAMIDE HCL 5 MG/ML IJ SOLN
10 mg | Freq: Once | INTRAVENOUS | 0 refills | Status: DC | PRN
Start: 2018-02-23 — End: 2018-02-23

## 2018-02-23 MED ORDER — PROPOFOL INJ 10 MG/ML IV VIAL
0 refills | Status: DC
Start: 2018-02-23 — End: 2018-02-23
  Administered 2018-02-23: 14:00:00 90 mg via INTRAVENOUS

## 2018-02-23 MED ORDER — ROCURONIUM 10 MG/ML IV SOLN
INTRAVENOUS | 0 refills | Status: DC
Start: 2018-02-23 — End: 2018-02-23
  Administered 2018-02-23: 14:00:00 40 mg via INTRAVENOUS

## 2018-02-23 MED ORDER — LACTATED RINGERS IV SOLP
1000 mL | INTRAVENOUS | 0 refills | Status: DC
Start: 2018-02-23 — End: 2018-02-23
  Administered 2018-02-23: 16:00:00 1000.000 mL via INTRAVENOUS
  Administered 2018-02-23: 13:00:00 1000 mL via INTRAVENOUS

## 2018-02-23 MED ORDER — PHENYLEPHRINE IN 0.9% NACL(PF) 1 MG/10 ML (100 MCG/ML) IV SYRG
INTRAVENOUS | 0 refills | Status: DC
Start: 2018-02-23 — End: 2018-02-23
  Administered 2018-02-23: 14:00:00 150 ug via INTRAVENOUS

## 2018-02-23 MED ORDER — FENTANYL CITRATE (PF) 50 MCG/ML IJ SOLN
50 ug | INTRAVENOUS | 0 refills | Status: DC | PRN
Start: 2018-02-23 — End: 2018-02-23
  Administered 2018-02-23: 17:00:00 50 ug via INTRAVENOUS

## 2018-02-25 ENCOUNTER — Encounter: Admit: 2018-02-25 | Discharge: 2018-02-25 | Payer: MEDICARE

## 2018-02-25 DIAGNOSIS — C61 Malignant neoplasm of prostate: Secondary | ICD-10-CM

## 2018-02-25 DIAGNOSIS — I10 Essential (primary) hypertension: Secondary | ICD-10-CM

## 2018-03-05 ENCOUNTER — Encounter: Admit: 2018-03-05 | Discharge: 2018-03-05 | Payer: MEDICARE

## 2018-03-05 DIAGNOSIS — S42352K Displaced comminuted fracture of shaft of humerus, left arm, subsequent encounter for fracture with nonunion: Secondary | ICD-10-CM

## 2018-03-08 ENCOUNTER — Encounter: Admit: 2018-03-08 | Discharge: 2018-03-08 | Payer: MEDICARE

## 2018-03-08 ENCOUNTER — Ambulatory Visit: Admit: 2018-03-08 | Discharge: 2018-03-09 | Payer: MEDICARE

## 2018-03-08 ENCOUNTER — Ambulatory Visit: Admit: 2018-03-08 | Discharge: 2018-03-08 | Payer: MEDICARE

## 2018-03-08 DIAGNOSIS — C61 Malignant neoplasm of prostate: Secondary | ICD-10-CM

## 2018-03-08 DIAGNOSIS — I10 Essential (primary) hypertension: Secondary | ICD-10-CM

## 2018-03-12 ENCOUNTER — Encounter: Admit: 2018-03-08 | Discharge: 2018-03-12 | Payer: MEDICARE

## 2018-03-12 DIAGNOSIS — S42352K Displaced comminuted fracture of shaft of humerus, left arm, subsequent encounter for fracture with nonunion: Secondary | ICD-10-CM

## 2018-03-12 DIAGNOSIS — S42302K Unspecified fracture of shaft of humerus, left arm, subsequent encounter for fracture with nonunion: Secondary | ICD-10-CM

## 2018-04-16 ENCOUNTER — Encounter: Admit: 2018-04-16 | Discharge: 2018-04-16 | Payer: MEDICARE

## 2018-04-16 DIAGNOSIS — S42302K Unspecified fracture of shaft of humerus, left arm, subsequent encounter for fracture with nonunion: Principal | ICD-10-CM

## 2018-04-19 ENCOUNTER — Ambulatory Visit: Admit: 2018-04-19 | Discharge: 2018-04-19 | Payer: MEDICARE

## 2018-04-19 ENCOUNTER — Encounter: Admit: 2018-04-19 | Discharge: 2018-04-19 | Payer: MEDICARE

## 2018-04-19 DIAGNOSIS — I10 Essential (primary) hypertension: Principal | ICD-10-CM

## 2018-04-19 DIAGNOSIS — C61 Malignant neoplasm of prostate: ICD-10-CM

## 2018-04-19 DIAGNOSIS — S42302K Unspecified fracture of shaft of humerus, left arm, subsequent encounter for fracture with nonunion: Principal | ICD-10-CM

## 2018-05-28 MED ORDER — GABAPENTIN 300 MG PO CAP
ORAL_CAPSULE | Freq: Three times a day (TID) | 0 refills
Start: 2018-05-28 — End: ?

## 2018-05-28 MED ORDER — TRAZODONE 50 MG PO TAB
ORAL_TABLET | Freq: Every evening | 0 refills | PRN
Start: 2018-05-28 — End: ?
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
## 2019-08-16 ENCOUNTER — Encounter: Admit: 2019-08-16 | Discharge: 2019-08-16 | Payer: MEDICARE

## 2019-09-15 ENCOUNTER — Encounter: Admit: 2019-09-15 | Discharge: 2019-09-15 | Payer: MEDICARE

## 2019-09-15 DIAGNOSIS — G959 Disease of spinal cord, unspecified: Secondary | ICD-10-CM

## 2019-09-20 ENCOUNTER — Encounter: Admit: 2019-09-20 | Discharge: 2019-09-20 | Payer: MEDICARE

## 2019-09-20 ENCOUNTER — Ambulatory Visit: Admit: 2019-09-20 | Discharge: 2019-09-20 | Payer: MEDICARE

## 2019-09-20 DIAGNOSIS — C61 Malignant neoplasm of prostate: Secondary | ICD-10-CM

## 2019-09-20 DIAGNOSIS — G959 Disease of spinal cord, unspecified: Secondary | ICD-10-CM

## 2019-09-20 DIAGNOSIS — M4316 Spondylolisthesis, lumbar region: Secondary | ICD-10-CM

## 2019-09-20 DIAGNOSIS — M9973 Connective tissue and disc stenosis of intervertebral foramina of lumbar region: Secondary | ICD-10-CM

## 2019-09-20 DIAGNOSIS — I10 Essential (primary) hypertension: Secondary | ICD-10-CM
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
## 2019-09-21 ENCOUNTER — Encounter: Admit: 2019-09-21 | Discharge: 2019-09-21 | Payer: MEDICARE

## 2019-09-21 DIAGNOSIS — R52 Pain, unspecified: Secondary | ICD-10-CM

## 2019-09-22 ENCOUNTER — Ambulatory Visit: Admit: 2019-09-22 | Discharge: 2019-09-22 | Payer: MEDICARE

## 2019-09-22 ENCOUNTER — Encounter: Admit: 2019-09-22 | Discharge: 2019-09-22 | Payer: MEDICARE

## 2019-09-22 DIAGNOSIS — R52 Pain, unspecified: Secondary | ICD-10-CM

## 2019-09-22 DIAGNOSIS — M5416 Radiculopathy, lumbar region: Secondary | ICD-10-CM

## 2019-09-22 DIAGNOSIS — I10 Essential (primary) hypertension: Secondary | ICD-10-CM

## 2019-09-22 DIAGNOSIS — C61 Malignant neoplasm of prostate: Secondary | ICD-10-CM

## 2019-09-22 MED ORDER — LIDOCAINE (PF) 10 MG/ML (1 %) IJ SOLN
2 mL | Freq: Once | INTRAMUSCULAR | 0 refills | Status: CP
Start: 2019-09-22 — End: ?

## 2019-09-22 MED ORDER — DEXAMETHASONE SODIUM PHOS (PF) 10 MG/ML IJ SOLN
10 mg | Freq: Once | 0 refills | Status: CP
Start: 2019-09-22 — End: ?

## 2019-09-22 MED ORDER — IOHEXOL 300 MG IODINE/ML IV SOLN
2 mL | Freq: Once | 0 refills | Status: CP
Start: 2019-09-22 — End: ?

## 2019-09-22 NOTE — Discharge Instructions - Supplementary Instructions
GENERAL POST PROCEDURE INSTRUCTIONS  Physician: _________________________________  Procedure Completed Today:  o Joint Injection (hip, knee, shoulder)  o Cervical Epidural Steroid Injection  o Cervical Transforaminal Steroid Injection  o Trigger Point Injection  o Caudal Epidural Steroid Injection  o Pudendal Nerve Block  o Other _____________________ o Thoracic Epidural Steroid Injection  o Lumbar Epidural Steroid Injection  o Lumbar Transforaminal Steroid Injection  o Facet Joint Injection  o Celiac Nerve Block  o Sacrococcygeal  o Sacroiliac Joint Injection   Important information following your procedure today:  o You may drive today     o If you had sedation, you may NOT drive today  - Rest at home for the next 6 hours.  You may then begin to resume your normal activities.  - DO NOT drive any vehicle, operate any power tools, drink alcohol, make any major decisions, or sign any legal documents for the next 12 hours.  1. Pain relief may not be immediate. It is possible you may even experience an increase in pain during the first 24-48 hours followed by a gradual decrease of your pain.  2. Though the procedure is generally safe, and complications are rare, we do ask that you be aware of any of the following:  ? Any swelling, persistent redness, new bleeding or drainage from the site of the injection.  ? You should not experience a severe headache.  ? You should not run a fever over 101oF.  ? New onset of sharp, severe back and or neck pain.  ? New onset of upper or lower extremity numbness or weakness.  ? New difficulty controlling bowel or bladder function after injection.  ? New shortness of breath.  ** If any of these occur, please call to report this occurrence to a nurse at [phone]. If you are calling after 4:00 p.m. or on weekends or holidays, please call [phone] and ask to have the resident physician on call for the physician paged or go to your local emergency room.  3. You may experience soreness at the injection site. Ice can be applied at 20-minute intervals for the first 24 hours. The following day you may alternate ice with heat if you are experiencing muscle tightness, otherwise continue with ice. Ice works best at decreasing pain. Avoid application of direct heat, hot showers or hot tubs today.  4. Avoid strenuous activity today. You many resume your regular activities and exercise tomorrow.  5. Patients with diabetes may see an elevation in blood sugars for 7-10 days after the injection. It is important to pay close attention to your diet, check your blood sugars daily and report extreme elevations to the physician that manages your diabetes.  6. Patients taking daily blood thinners can resume their regular dose this evening.  7. It is important that you take all medications ordered by your pain physician. Taking medications as ordered is an important part of your pain care plan. If you cannot continue the medication plan, please notify the physician.    Possible side effects to steroids that may occur:  ? Flushing or redness of the face  ? Irritability  ? Fluid retention  ? Change in women's menses  ? Minor headache    If you are unable to keep your upcoming appointment, please notify the [facility] scheduler at [phone] at least 24 hours in advance. If you have questions for the surgery center, call [facility] at [phone].

## 2019-09-22 NOTE — Procedures
Attending Surgeon: Elvia Collum, MD    Anesthesia: Local    Pre-Procedure Diagnosis:   1. Lumbar radiculopathy        Post-Procedure Diagnosis:   1. Lumbar radiculopathy        [place] AMB SPINE INJECT SNRB/TFESI LUMBAR/SACRAL  Procedure: transforaminal epidural    Laterality: left   on 09/22/2019 9:50 AM  Location: lumbar - L5-S1      Consent:   Consent obtained: verbal and written  Consent given by: patient  Risks discussed: allergic reaction, bleeding, infection, nerve damage and seizure  Alternatives discussed: no treatment  Discussed with patient the purpose of the treatment/procedure, other ways of treating my condition, including no treatment/ procedure and the risks and benefits of the alternatives. Patient has decided to proceed with treatment/procedure.        Universal Protocol:  Relevant documents: relevant documents present and verified  Test results: test results available and properly labeled  Imaging studies: imaging studies available  Required items: required blood products, implants, devices, and special equipment available  Site marked: the operative site was marked  Patient identity confirmed: Patient identify confirmed verbally with patient.        Time out: Immediately prior to procedure a time out was called to verify the correct patient, procedure, equipment, support staff and site/side marked as required      Procedures Details:   Indications: pain   Prep: chlorhexidine  Estimated Blood Loss: minimal  Specimens: none  Amount Injected:   L5-S1: 2mL  Number of Joints: 1  Approach: left paramedian  Guidance: fluoroscopy  Contrast: Procedure confirmed with contrast under live fluoroscopy.  Needle and Epidural Catheter: quincke  Needle size: 25 G  Injection procedure: Incremental injection, Negative aspiration for blood and Introduced needle  Patient tolerance: Patient tolerated the procedure well with no immediate complications. Pressure was applied, and hemostasis was accomplished.  Outcome: Pain unchanged  Comments: INTERVENTIONAL PAIN MANAGEMENT PROCEDURE REPORT    Lumbar Transforaminal Epidural Injection    Date of Service: 09/22/19    Procedure Title(s):    1. Left L5-S1 transforaminal epidural injection   2. Intraoperative fluoroscopy     Attending:  Elvia Collum, MD    Anesthesia: Local    Indications: Mike Gonzalez is a 69 y.o. male with a diagnosis of lumbar radicular pain. The patient's history and physical exam were reviewed. The risks, benefits and alternatives to the procedure were discussed, and all questions were answered to the patient's satisfaction. The patient agreed to proceed, and written informed consent was obtained.     Procedure in Detail:    The patient was brought into the procedure room and placed in the prone position on the fluoroscopy table. Standard monitors were placed, and vital signs were observed throughout the procedure. The area of the lumbar spine was prepped with 2% chlorhexidine and draped in a sterile manner.     The L5 vertebral body was identified with AP fluoroscopy. An oblique view to the  left was obtained to better visualize the inferior junction of the pedicle and transverse process. The 6 o?clock position of the pedicle was marked and identified. The skin and subcutaneous tissues in the area were anesthetized with 1% lidocaine. A 25-gauge needle was directed toward the targeted point under fluoroscopy until bone was contacted. The needle was then walked inferiorly until the neural foramen was entered. A lateral fluoroscopic view was then used to place the needle tip at the posterior and cephalad position in  the foramen.     Negative aspiration was confirmed, and 1ml of contrast was injected at each level. Appropriate neurograms were observed under live AP fluoroscopy with no noted vascular or intrathecal uptake. Then, after negative aspiration, a solution consisting of 1.5  mL of sterile saline and 7.5 mg decadron was easily injected at each level. The needle was removed with a saline flush. The patient?s back was cleaned and a bandage was placed over the needle insertion points.    The same procedure was performed on the opposite side? No    Disposition: The patient tolerated the procedure well, and there were no apparent complications. Vital signs remained stable througtout the procedure. The patient was taken to the recovery area where discharge instructions for the procedure were given.     Estimated Blood Loss: Minimal    Specimens: None    Complications: None          Estimated blood loss: none or minimal  Specimens: none  Patient tolerated the procedure well with no immediate complications. Pressure was applied, and hemostasis was accomplished.

## 2019-09-22 NOTE — Progress Notes
SPINE CENTER  INTERVENTIONAL PAIN PROCEDURE HISTORY AND PHYSICAL    Cc:  Low back pain    HISTORY OF PRESENT ILLNESS:  Patient presents today with left leg pain and is here for TFESI.  Patient denies any new medical conditions or medications.  Patient states they have been off of anticoagulants and antiplatelets as instructed during clinic visit.  Patient denies any recent illnesses, infections, and is off of antibiotics.    In short, he had previously seen Dr. [place] on 09/20/19 for low back pain of 3 months of duration with left sided buttock pain.  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  He has not tried conservative mesaures for which she presents today for a left TFESI    Medical History:   Diagnosis Date   ? HTN (hypertension)    ? Prostate cancer [facility])        Surgical History:   Procedure Laterality Date   ? DEBRIDEMENT WOUND MEDIUM IN OPEN FRACTURE/ DISLOCATION - UPPER EXTREMITY Left 05/19/2017    Performed by Azzie Glatter, MD at [facility] OR   ? DEBRIDEMENT WOUND MEDIUM IN OPEN FRACTURE/ DISLOCATION - UPPER EXTREMITY Left 05/22/2017    Performed by Azzie Glatter, MD at [facility] OR   ? APPLICATION NEGATIVE PRESSURE WOUND THERAPY Left 05/22/2017    Performed by Azzie Glatter, MD at [facility] OR   ? OPEN REDUCTION INTERNAL FIXATION SEGMENTAL HUMERUS FRACTURE - HUMERAL SHAFT AND SUPRACONDYLAR FRACTURES Left 05/26/2017    Performed by Azzie Glatter, MD at [facility] OR   ? DEBRIDEMENT WOUND DEEP IN OPEN FRACTURE/ DISLOCATION - UPPER EXTREMITY Left 05/26/2017    Performed by Azzie Glatter, MD at [facility] OR   ? OPEN TREATMENT NONUNION LEFT HUMERUS. Left 08/25/2017    Performed by Azzie Glatter, MD at [facility] OR   ? CYSTOSCOPY WITH FOLEY INSERTION. N/A 08/25/2017    Performed by Azzie Glatter, MD at [facility] OR   ? REMOVAL HARDWARE LEFT HUMERUS. Left 08/25/2017    Performed by Azzie Glatter, MD at [facility] OR   ? REPAIR NONUNION/ MALUNION HUMERUS WITH ILIAC/ ILIAC/ ANOTHER AUTOGRAFT Left 02/23/2018    Performed by Azzie Glatter, MD at [facility] OR       family history includes Cancer-Colon in his brother; Heart Attack in his father.    Social History     Socioeconomic History   ? Marital status: Single     Spouse name: Not on file   ? Number of children: Not on file   ? Years of education: Not on file   ? Highest education level: Not on file   Occupational History   ? Not on file   Tobacco Use   ? Smoking status: Current Every Day Smoker     Packs/day: 0.50     Types: Cigarettes   ? Smokeless tobacco: Never Used   Substance and Sexual Activity   ? Alcohol use: Yes   ? Drug use: Never   ? Sexual activity: Not on file   Other Topics Concern   ? Not on file   Social History Narrative   ? Not on file       No Known Allergies    Vitals:    09/22/19 1003 09/22/19 1012 09/22/19 1021   BP: 118/81 (!) 140/82 117/80   BP Source: Arm, Right Upper Arm, Right Upper Arm, Right  Upper   Pulse: 60     Temp: 36.6 ?C (97.9 ?F)     SpO2: 98% 96% 98%   Weight: 68 kg (150 lb)     Height: 172.7 cm (68)         REVIEW OF SYSTEMS: 14 point ROS obtained and negative except for those noted in HPI.      PHYSICAL EXAM:  General: Alert, cooperative, no distress  Head: Normocephalic, atraumatic  Eyes: Conjunctivae/corneas clear  Lungs: Unlabored respirations  Heart: Normal rate by palpation of pulse  Abdomen: Non-distended  Skin: Warm and dry to touch  Psychiatric: Mood and affect normal  Musculoskeletal: Moves all extremities  Neurological: Grossly intact.      IMPRESSION:    1. Lumbar radiculopathy          PLAN:   Plan to proceed with left L5-S1 TFESI.      Elvia Collum, MD  Department of Anesthesiology and Pain Medicine  [facility]  Personal Pager [phone]

## 2019-12-16 NOTE — Patient Instructions
It was nice to see you today.  Thank you for choosing to visit our clinic.  Your time is important, and if you had to wait today, we do apologize.  Our goal is to run exactly on time.  However, on occasion, we get behind in clinic due to unexpected patient issues.  Thank you for your patience.    General Instructions:  ? Scheduling:  Our scheduling phone number is [phone].  ? Appointment Reminders on your cell phone:  Make sure we have your cell phone number in your account, and text [place] to [phone].  ? How to reach our office:  Please send a MyChart message to the Spine Center or leave a voicemail for the nurse at [phone].  ? How to get a medication refill:  Please use the MyChart Refill request or contact your pharmacy directly to request medication refills.  Please allow 72 business hours for request to be completed.    ? Support for many chronic illnesses is available through [employer] at [email or web address] or [phone].    ? For help with MyChart:  please call [phone].    ? For questions on nights, weekends or holidays:  call the Operator at [phone], and ask for the doctor on call for Neurosurgery.    ? For more information on spinal conditions:  please visit [email or web address]     Again, thank you for coming in today.     Genelle Gather, RN, BSN  Clinical Nurse Coordinator for Dr. Mauricio Po. Clydene Pugh, MD, [facility]  [facility]  Phone [phone]  Fax [phone]

## 2019-12-20 ENCOUNTER — Ambulatory Visit: Admit: 2019-12-20 | Discharge: 2019-12-20 | Payer: MEDICARE

## 2019-12-20 ENCOUNTER — Encounter: Admit: 2019-12-20 | Discharge: 2019-12-20 | Payer: MEDICARE

## 2019-12-20 DIAGNOSIS — M4316 Spondylolisthesis, lumbar region: Secondary | ICD-10-CM

## 2019-12-20 DIAGNOSIS — M9923 Subluxation stenosis of neural canal of lumbar region: Secondary | ICD-10-CM

## 2019-12-20 DIAGNOSIS — I10 Essential (primary) hypertension: Secondary | ICD-10-CM

## 2019-12-20 DIAGNOSIS — C61 Malignant neoplasm of prostate: Secondary | ICD-10-CM

## 2019-12-20 NOTE — Progress Notes
Date of Service: 12/20/2019        Chief complaint    Chief Complaint   Patient presents with   ? Spine - Pain   ? Follow Up     3 MO FUV       HPI  Mike Gonzalez is a 69 y.o. male here for follow-up after recent epidural steroid injections.  He reports significant improvement proximal 80% in his left leg and buttock pain after the injections.  In addition, he notes improved function in his left dorsiflexion plantarflexion of the left foot.  He does state that left foot is still weak.    PMH    Medical History:   Diagnosis Date   ? HTN (hypertension)    ? Prostate cancer (HCC)            ROS  Review of Systems   Musculoskeletal: Positive for back pain.   All other systems reviewed and are negative.         FH    Family History   Problem Relation Age of Onset   ? Heart Attack Father    ? Cancer-Colon Brother      Social History     Socioeconomic History   ? Marital status: Single     Spouse name: Not on file   ? Number of children: Not on file   ? Years of education: Not on file   ? Highest education level: Not on file   Occupational History   ? Not on file   Tobacco Use   ? Smoking status: Current Every Day Smoker     Packs/day: 0.50     Types: Cigarettes   ? Smokeless tobacco: Never Used   Substance and Sexual Activity   ? Alcohol use: Yes   ? Drug use: Never   ? Sexual activity: Not on file   Other Topics Concern   ? Not on file   Social History Narrative   ? Not on file           [facility]    Surgical History:   Procedure Laterality Date   ? DEBRIDEMENT WOUND MEDIUM IN OPEN FRACTURE/ DISLOCATION - UPPER EXTREMITY Left 05/19/2017    Performed by Azzie Glatter, MD at [facility] OR   ? DEBRIDEMENT WOUND MEDIUM IN OPEN FRACTURE/ DISLOCATION - UPPER EXTREMITY Left 05/22/2017    Performed by Azzie Glatter, MD at [facility] OR   ? APPLICATION NEGATIVE PRESSURE WOUND THERAPY Left 05/22/2017    Performed by Azzie Glatter, MD at [facility] OR   ? OPEN REDUCTION INTERNAL FIXATION SEGMENTAL HUMERUS FRACTURE - HUMERAL SHAFT AND SUPRACONDYLAR FRACTURES Left 05/26/2017    Performed by Azzie Glatter, MD at [facility] OR   ? DEBRIDEMENT WOUND DEEP IN OPEN FRACTURE/ DISLOCATION - UPPER EXTREMITY Left 05/26/2017    Performed by Azzie Glatter, MD at [facility] OR   ? OPEN TREATMENT NONUNION LEFT HUMERUS. Left 08/25/2017    Performed by Azzie Glatter, MD at [facility] OR   ? CYSTOSCOPY WITH FOLEY INSERTION. N/A 08/25/2017    Performed by Azzie Glatter, MD at [facility] OR   ? REMOVAL HARDWARE LEFT HUMERUS. Left 08/25/2017    Performed by Azzie Glatter, MD at [facility] OR   ? REPAIR NONUNION/ MALUNION HUMERUS WITH ILIAC/ ILIAC/ ANOTHER AUTOGRAFT Left 02/23/2018    Performed by Azzie Glatter, MD at [facility] OR       Meds    ? acetaminophen (TYLENOL)  325 mg tablet Take two tablets by mouth every 6 hours as needed.   ? citalopram (CELEXA) 20 mg tablet Take 20 mg by mouth daily.   ? enalapril (VASOTEC) 10 mg tablet Take 10 mg by mouth daily.   ? gabapentin (NEURONTIN) 300 mg capsule Take one capsule by mouth every 8 hours.   ? melatonin 5 mg tab Take one tablet by mouth at bedtime daily.   ? Omega-3 Fatty Acids (SUPER OMEGA-3) 1,000 mg capsule Take 2,000 mg by mouth daily.   ? propranolol XL (INNOPRAN XL) 120 mg cp24 capsule Take 120 mg by mouth every 48 hours.   ? simvastatin (ZOCOR) 80 mg tablet Take 80 mg by mouth at bedtime daily.   ? traZODone (DESYREL) 50 mg tablet Take one tablet by mouth at bedtime as needed for Sleep.       Allergies    No Known Allergies      Exam  Physical Exam   Constitutional: he is oriented to person, place, and time. he appears well-developed and well-nourished.    HENT:    Head: Normocephalic and atraumatic.    Nose: Nose normal.    Mouth/Throat: Oropharynx is clear and moist.    Eyes: Conjunctivae and EOM are normal.    Neck: Normal range of motion. No tracheal deviation present.    Pulmonary/Chest: Effort normal. No respiratory distress.   Musculoskeletal: Normal range of motion. he exhibits no edema or tenderness.   Lymphadenopathy:     he has no cervical adenopathy.   Neurological: he is alert and oriented to person, place, and time. No cranial nerve deficit or sensory deficit. he exhibits normal muscle tone. Coordination normal.   Skin: Skin is warm and dry.   Psychiatric: he has a normal mood and affect. his behavior is normal. Judgment and thought content normal.    Vitals reviewed.  A&Ox3  FS, TML, EOMI      D B Tr Hg IO  R 5/5 5/5 5/5 5/5 5/5  L 5/5 5/5 5/5 5/5 5/5        HF KE DF PF EHL  R  5/5 5/5 5/5 5/5 5/5  L  5/5 5/5 4+/5 4+/5 4+/5  Antalgic gait    Vitals:   Vitals:    12/20/19 1205   BP: 123/69   BP Source: Arm, Right Upper   Patient Position: Sitting   Pulse: 52   Resp: 18   SpO2: 100%   Weight: 68 kg (150 lb)   Height: 172.7 cm (68)   PainSc: Two     Body mass index is 22.81 kg/m?.      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  I understand this.  We will refer for more epidural steroid injections.  He return to see me in 3 months.                                  No orders of the defined types were placed in this encounter.

## 2020-01-19 ENCOUNTER — Encounter: Admit: 2020-01-19 | Discharge: 2020-01-19 | Payer: MEDICARE

## 2020-01-19 DIAGNOSIS — M5416 Radiculopathy, lumbar region: Secondary | ICD-10-CM

## 2020-01-20 ENCOUNTER — Ambulatory Visit: Admit: 2020-01-20 | Discharge: 2020-01-20 | Payer: MEDICARE

## 2020-01-20 ENCOUNTER — Encounter: Admit: 2020-01-20 | Discharge: 2020-01-20 | Payer: MEDICARE

## 2020-01-20 DIAGNOSIS — M9923 Subluxation stenosis of neural canal of lumbar region: Secondary | ICD-10-CM

## 2020-01-20 DIAGNOSIS — I10 Essential (primary) hypertension: Secondary | ICD-10-CM

## 2020-01-20 DIAGNOSIS — M5416 Radiculopathy, lumbar region: Secondary | ICD-10-CM

## 2020-01-20 DIAGNOSIS — C61 Malignant neoplasm of prostate: Secondary | ICD-10-CM

## 2020-01-20 DIAGNOSIS — M4316 Spondylolisthesis, lumbar region: Secondary | ICD-10-CM

## 2020-01-20 MED ORDER — IOHEXOL 240 MG IODINE/ML IV SOLN
2.5 mL | Freq: Once | EPIDURAL | 0 refills | Status: CP
Start: 2020-01-20 — End: ?
  Administered 2020-01-20: 21:00:00 2.5 mL via EPIDURAL

## 2020-01-20 MED ORDER — DEXAMETHASONE SODIUM PHOS (PF) 10 MG/ML IJ SOLN
10 mg | Freq: Once | 0 refills | Status: CP
Start: 2020-01-20 — End: ?
  Administered 2020-01-20: 21:00:00 10 mg

## 2020-01-20 NOTE — Procedures
Attending Surgeon: Elvia Collum, MD    Anesthesia: Local    Pre-Procedure Diagnosis:   1. Lumbar radiculopathy    2. Spondylolisthesis of lumbar region    3. 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 Lumbar radiculopathy    2. Spondylolisthesis of lumbar region    3. 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 Patient has decided to proceed with treatment/procedure.        Universal Protocol:  Relevant documents: relevant documents present and verified  Test results: test results available and properly labeled  Imaging studies: imaging studies available  Required items: required blood products, implants, devices, and special equipment available  Site marked: the operative site was marked  Patient identity confirmed: Patient identify confirmed verbally with patient.        Time out: Immediately prior to procedure a time out was called to verify the correct patient, procedure, equipment, support staff and site/side marked as required      Procedures Details:   Indications: pain   Prep: chlorhexidine  Estimated Blood Loss: minimal  Specimens: none  Number of Joints: 1  Approach: left paramedian  Guidance: fluoroscopy  Contrast: Procedure confirmed with contrast under live fluoroscopy.  Needle and Epidural Catheter: quincke  Needle size: 25 G  Injection procedure: Incremental injection, Negative aspiration for blood and Introduced needle  Amount Injected:   L5-S1: 2mL  Patient tolerance: Patient tolerated the procedure well with no immediate complications. Pressure was applied, and hemostasis was accomplished.  Outcome: Pain unchanged  Comments: INTERVENTIONAL PAIN MANAGEMENT PROCEDURE REPORT    Lumbar Transforaminal Epidural Injection    Date of Service: 01/20/20    Procedure Title(s):    1. Left L5-S1 transforaminal epidural injection   2. Intraoperative fluoroscopy     Attending:  Elvia Collum, MD    Anesthesia: Local    Indications: Mike Gonzalez is a 69 y.o. male with a diagnosis of lumbar radicular pain. The patient's history and physical exam were reviewed. The risks, benefits and alternatives to the procedure were discussed, and all questions were answered to the patient's satisfaction. The patient agreed to proceed, and written informed consent was obtained.     Procedure in Detail:    The patient was brought into the procedure room and placed in the prone position on the fluoroscopy table. Standard monitors were placed, and vital signs were observed throughout the procedure. The area of the lumbar spine was prepped with 2% chlorhexidine and draped in a sterile manner.     The L5 vertebral body was identified with AP fluoroscopy. An oblique view to the  left was obtained to better visualize the inferior junction of the pedicle and transverse process. The 6 o?clock position of the pedicle was marked and identified. The skin and subcutaneous tissues in the area were anesthetized with 1% lidocaine. A 25-gauge needle was directed toward the targeted point  under fluoroscopy until bone was contacted. The needle was then walked inferiorly until the neural foramen was entered. A lateral fluoroscopic view was then used to place the needle tip at the posterior and cephalad position in the foramen.     Negative aspiration was confirmed, and 1ml of contrast was injected at each level. Appropriate neurograms were observed under live AP fluoroscopy with no noted vascular or intrathecal uptake. Then, after negative aspiration, a solution consisting of 1.5  mL of sterile saline and 7.5 mg decadron was easily injected at each level. The needle was removed with a saline flush. The patient?s back was cleaned and a bandage was placed over the needle insertion points.    The same procedure was performed on the opposite side? No    Disposition: The patient tolerated the procedure well, and there were no apparent complications. Vital signs remained stable througtout the procedure. The patient was taken to the recovery area where discharge instructions for the procedure were given.     Estimated Blood Loss: Minimal    Specimens: None    Complications: None          Estimated blood loss: none or minimal  Specimens: none  Patient tolerated the procedure well with no immediate complications. Pressure was applied, and hemostasis was accomplished.

## 2020-01-20 NOTE — Progress Notes
SPINE CENTER  INTERVENTIONAL PAIN PROCEDURE HISTORY AND PHYSICAL    Cc:  Left leg pain    HISTORY OF PRESENT ILLNESS:  Patient presents today with left leg pain and is here for TFESI.  Patient denies any new medical conditions or medications.  Patient states they have been off of anticoagulants and antiplatelets as instructed during clinic visit.  Patient denies any recent illnesses, infections, and is off of antibiotics     Medical History:   Diagnosis Date   ? HTN (hypertension)    ? Prostate cancer [facility])        Surgical History:   Procedure Laterality Date   ? DEBRIDEMENT WOUND MEDIUM IN OPEN FRACTURE/ DISLOCATION - UPPER EXTREMITY Left 05/19/2017    Performed by Azzie Glatter, MD at [facility] OR   ? DEBRIDEMENT WOUND MEDIUM IN OPEN FRACTURE/ DISLOCATION - UPPER EXTREMITY Left 05/22/2017    Performed by Azzie Glatter, MD at [facility] OR   ? APPLICATION NEGATIVE PRESSURE WOUND THERAPY Left 05/22/2017    Performed by Azzie Glatter, MD at [facility] OR   ? OPEN REDUCTION INTERNAL FIXATION SEGMENTAL HUMERUS FRACTURE - HUMERAL SHAFT AND SUPRACONDYLAR FRACTURES Left 05/26/2017    Performed by Azzie Glatter, MD at [facility] OR   ? DEBRIDEMENT WOUND DEEP IN OPEN FRACTURE/ DISLOCATION - UPPER EXTREMITY Left 05/26/2017    Performed by Azzie Glatter, MD at [facility] OR   ? OPEN TREATMENT NONUNION LEFT HUMERUS. Left 08/25/2017    Performed by Azzie Glatter, MD at [facility] OR   ? CYSTOSCOPY WITH FOLEY INSERTION. N/A 08/25/2017    Performed by Azzie Glatter, MD at [facility] OR   ? REMOVAL HARDWARE LEFT HUMERUS. Left 08/25/2017    Performed by Azzie Glatter, MD at [facility] OR   ? REPAIR NONUNION/ MALUNION HUMERUS WITH ILIAC/ ILIAC/ ANOTHER AUTOGRAFT Left 02/23/2018    Performed by Azzie Glatter, MD at [facility] OR       family history includes Cancer-Colon in his brother; Heart Attack in his father.    Social History     Socioeconomic History   ? Marital status: Single     Spouse name: Not on file   ? Number of children: Not on file   ? Years of education: Not on file   ? Highest education level: Not on file   Occupational History   ? Not on file   Tobacco Use   ? Smoking status: Current Every Day Smoker     Packs/day: 0.50     Types: Cigarettes   ? Smokeless tobacco: Never Used   Substance and Sexual Activity   ? Alcohol use: Yes   ? Drug use: Never   ? Sexual activity: Not on file   Other Topics Concern   ? Not on file   Social History Narrative   ? Not on file       No Known Allergies    Vitals:    01/20/20 1435 01/20/20 1439   BP: (!) 142/82    BP Source: Arm, Right Upper    Patient Position: Sitting    Pulse: 66    Resp: 18    Temp: 37.2 ?C (98.9 ?F)    TempSrc: Oral    SpO2: 99%    Weight: 68 kg (150 lb)    Height: 172.7 cm (68)    PainSc: Three Three       REVIEW OF SYSTEMS: 14 point ROS obtained and negative except for those  noted in HPI.      PHYSICAL EXAM:  General: Alert, cooperative, no distress  Head: Normocephalic, atraumatic  Eyes: Conjunctivae/corneas clear  Lungs: Unlabored respirations  Heart: Normal rate by palpation of pulse  Abdomen: Non-distended  Skin: Warm and dry to touch  Psychiatric: Mood and affect normal  Musculoskeletal: Moves all extremities  Neurological: Grossly intact.      IMPRESSION:    1. Spondylolisthesis of lumbar region    2. 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      Elvia Collum, MD  Department of Anesthesiology and Pain Medicine  [facility]  Personal Pager [phone]

## 2020-01-20 NOTE — Progress Notes

## 2020-07-03 ENCOUNTER — Encounter: Admit: 2020-07-03 | Discharge: 2020-07-03 | Payer: MEDICARE

## 2020-07-04 ENCOUNTER — Encounter: Admit: 2020-07-04 | Discharge: 2020-07-04 | Payer: MEDICARE

## 2020-07-04 NOTE — Telephone Encounter
Patient sent a message via My Chart requesting a phone call. He is c/o new right hip pain that wraps around thigh and has tingling/numbness in his shin and calf. He has had injections but was never scheduled for a f/u visit with Dr. [place]. He also wants to know if he needs a new MRI. I informed him I would reach out to Dr. [place] and message him via My Chart. Patient stated understanding.

## 2020-07-05 ENCOUNTER — Encounter: Admit: 2020-07-05 | Discharge: 2020-07-05 | Payer: MEDICARE

## 2020-07-05 DIAGNOSIS — M4316 Spondylolisthesis, lumbar region: Secondary | ICD-10-CM

## 2020-07-05 DIAGNOSIS — G959 Disease of spinal cord, unspecified: Secondary | ICD-10-CM

## 2020-07-05 DIAGNOSIS — M5416 Radiculopathy, lumbar region: Secondary | ICD-10-CM

## 2020-08-15 ENCOUNTER — Encounter: Admit: 2020-08-15 | Discharge: 2020-08-15 | Payer: MEDICARE

## 2020-08-15 DIAGNOSIS — R69 Illness, unspecified: Secondary | ICD-10-CM

## 2020-08-15 NOTE — Patient Instructions
It was nice to see you today.  Thank you for choosing to visit our clinic.  Your time is important, and if you had to wait today, we do apologize.  Our goal is to run exactly on time.  However, on occasion, we get behind in clinic due to unexpected patient issues.  Thank you for your patience.    General Instructions:   Scheduling:  Our scheduling phone number is [phone].   Appointment Reminders on your cell phone:  Make sure we have your cell phone number in your account, and text [place] to [phone].   How to reach our office:  Please send a MyChart message to the Spine Center or leave a voicemail for the nurse  , RN, BSN at [phone].   How to get a medication refill:  Please use the MyChart Refill request or contact your pharmacy directly to request medication refills.  Please allow 72 business hours for request to be completed.     Support for many chronic illnesses is available through [employer] at [email or web address] or [phone].     For help with MyChart:  please call [phone].     For questions on nights, weekends or holidays:  call the Operator at [phone], and ask for the doctor on call for Neurosurgery.     For more information on spinal conditions:  please visit [email or web address]    Our office fax number is [phone]    Again, thank you for coming in today.        , RN, BSN  Clinical Nurse Coordinator for Dr. Ifije Ohiorhenuan  Ambulatory Clinic Nurse Supervisor  Marc A. Asher, MD, [facility]  [facility]  Phone [phone]  Fax [phone]

## 2020-08-28 ENCOUNTER — Encounter: Admit: 2020-08-28 | Discharge: 2020-08-28 | Payer: MEDICARE

## 2020-08-28 ENCOUNTER — Ambulatory Visit: Admit: 2020-08-28 | Discharge: 2020-08-28 | Payer: MEDICARE

## 2020-08-28 DIAGNOSIS — M9923 Subluxation stenosis of neural canal of lumbar region: Secondary | ICD-10-CM

## 2020-08-28 DIAGNOSIS — M48062 Spinal stenosis, lumbar region with neurogenic claudication: Secondary | ICD-10-CM

## 2020-08-28 DIAGNOSIS — M4316 Spondylolisthesis, lumbar region: Secondary | ICD-10-CM

## 2020-08-28 NOTE — Progress Notes
Date of Service: 08/28/2020        Chief complaint    Chief Complaint   Patient presents with   ? Back Pain     And leg       HPI  Mike Gonzalez is a 70 y.o. male who presents with a history of low back pain difficulty walking.  He complains of low back pain worse with activity relieved with rest.  He recently underwent epidural steroid injection some good but temporary relief of his symptoms.  Line he is interested in more durable solution                  PMH    Medical History:   Diagnosis Date   ? HTN (hypertension)    ? Prostate cancer (HCC)            ROS  Review of Systems       FH    Family History   Problem Relation Age of Onset   ? Heart Attack Father    ? Cancer-Colon Brother      Social History     Socioeconomic History   ? Marital status: Single   Tobacco Use   ? Smoking status: Current Every Day Smoker     Packs/day: 0.50     Types: Cigarettes   ? Smokeless tobacco: Never Used   Substance and Sexual Activity   ? Alcohol use: Yes   ? Drug use: Never           SH    Surgical History:   Procedure Laterality Date   ? DEBRIDEMENT WOUND MEDIUM IN OPEN FRACTURE/ DISLOCATION - UPPER EXTREMITY Left 05/19/2017    Performed by Azzie Glatter, MD at [facility] OR   ? DEBRIDEMENT WOUND MEDIUM IN OPEN FRACTURE/ DISLOCATION - UPPER EXTREMITY Left 05/22/2017    Performed by Azzie Glatter, MD at [facility] OR   ? APPLICATION NEGATIVE PRESSURE WOUND THERAPY Left 05/22/2017    Performed by Azzie Glatter, MD at [facility] OR   ? OPEN REDUCTION INTERNAL FIXATION SEGMENTAL HUMERUS FRACTURE - HUMERAL SHAFT AND SUPRACONDYLAR FRACTURES Left 05/26/2017    Performed by Azzie Glatter, MD at [facility] OR   ? DEBRIDEMENT WOUND DEEP IN OPEN FRACTURE/ DISLOCATION - UPPER EXTREMITY Left 05/26/2017    Performed by Azzie Glatter, MD at [facility] OR   ? OPEN TREATMENT NONUNION LEFT HUMERUS. Left 08/25/2017    Performed by Azzie Glatter, MD at [facility] OR   ? CYSTOSCOPY WITH FOLEY INSERTION. N/A 08/25/2017    Performed by Azzie Glatter, MD at [facility] OR   ? REMOVAL HARDWARE LEFT HUMERUS. Left 08/25/2017    Performed by Azzie Glatter, MD at [facility] OR   ? REPAIR NONUNION/ MALUNION HUMERUS WITH ILIAC/ ILIAC/ ANOTHER AUTOGRAFT Left 02/23/2018    Performed by Azzie Glatter, MD at [facility] OR       Meds    ? acetaminophen (TYLENOL) 325 mg tablet Take two tablets by mouth every 6 hours as needed.   ? citalopram (CELEXA) 20 mg tablet Take 20 mg by mouth daily.   ? enalapril (VASOTEC) 10 mg tablet Take 10 mg by mouth daily.   ? gabapentin (NEURONTIN) 300 mg capsule Take one capsule by mouth every 8 hours.   ? melatonin 5 mg tab Take one tablet by mouth at bedtime daily.   ? Omega-3 Fatty Acids 1,000 mg capsule Take 2,000 mg by mouth daily.   ?  propranolol XL (INNOPRAN XL) 120 mg cp24 capsule Take 120 mg by mouth every 48 hours.   ? simvastatin (ZOCOR) 80 mg tablet Take 80 mg by mouth at bedtime daily.   ? traZODone (DESYREL) 50 mg tablet Take one tablet by mouth at bedtime as needed for Sleep.       Allergies    No Known Allergies      Exam  Physical Exam   Constitutional: he is oriented to person, place, and time. he appears well-developed and well-nourished.    Head: Normocephalic and atraumatic.    Eyes: Conjunctivae and EOM are normal.    Pulmonary/Chest: Effort normal. No respiratory distress.   Neurological: he is alert and oriented to person, place, and time. No cranial nerve deficit or sensory deficit. he exhibits normal muscle tone. Coordination normal.   Skin: Skin is warm and dry.   Psychiatric: he has a normal mood and affect. his behavior is normal. Judgment and thought content normal.    Vitals reviewed.  A&Ox3  FS, TML, EOMI      D B Tr Hg IO  R 5/5 5/5 5/5 5/5 5/5  L 5/5 5/5 5/5 5/5 5/5        HF KE DF PF EHL  R  5/5 5/5 5/5 5/5 5/5  L  5/5 5/5 5/5 5/5 5/5      Vitals:   Vitals:    08/28/20 1538   BP: (!) 148/71   BP Source: Arm, Left Upper   Pulse: 60   Temp: 36.5 ?C (97.7 ?F)   SpO2: 100%   PainSc: Seven   Weight: 68 kg (150 lb)   Height: 172.7 cm (5' 8)     Body mass index is 22.81 kg/m?.      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  I discussed with the patient his possible surgical treatment options.  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  However, he may also benefit from a more limited intervention: L4-S1 laminectomy.  With the understanding that we may destabilize his spine further causing progressive low back pain which may ultimately require fusion.  He would like to try the smaller surgery first.  I think this is reasonable.  We will plan for surgery at a mutually convenient time.  We will plan for an L4-S1 lumbar laminectomy.  Patient has had an adequate trial of > 12 month of rest, exercise, multimodal treatment, and the passage of time without improvement of symptoms. The pain has significant impact on the daily quality of life.     The risks and benefits of surgery were explained in detail to the patient which included, but certainly were not limited to: bleeding, infection, nerve or vessel damage, scar, pain, risks of anesthesia, death, need for further surgery, iatrogenic instability, heart attack, stroke, massive bleeding, coma death. The patient understands the risks of the procedure and elects to proceed.  No orders of the defined types were placed in this encounter.                             Please note that documentation of records were done during a busy neurosurgical clinic. Attempts have been made to review the document for any errors. Please excuse for brevity and typographical errors.

## 2020-08-31 ENCOUNTER — Encounter: Admit: 2020-08-31 | Discharge: 2020-08-31 | Payer: MEDICARE

## 2020-08-31 DIAGNOSIS — M4316 Spondylolisthesis, lumbar region: Secondary | ICD-10-CM

## 2020-08-31 DIAGNOSIS — M9923 Subluxation stenosis of neural canal of lumbar region: Secondary | ICD-10-CM

## 2020-08-31 DIAGNOSIS — M48062 Spinal stenosis, lumbar region with neurogenic claudication: Secondary | ICD-10-CM

## 2020-08-31 MED ORDER — CEFAZOLIN IN 0.9% SOD CHLORIDE 2 GRAM/110 ML IVPB
2 g | Freq: Once | INTRAVENOUS | 0 refills
Start: 2020-08-31 — End: ?

## 2020-08-31 NOTE — Telephone Encounter
Spoke with patient and he is ready to schedule surgery. Scheduled for 9/19. If any thing opens up sooner, I will let him know. Patient stated understanding.

## 2020-09-11 ENCOUNTER — Encounter: Admit: 2020-09-11 | Discharge: 2020-09-11 | Payer: MEDICARE

## 2020-09-12 ENCOUNTER — Encounter: Admit: 2020-09-12 | Discharge: 2020-09-12 | Payer: MEDICARE

## 2020-09-12 NOTE — Telephone Encounter
LVM to confirm PAT and surgery date, time and location. My contact information has been provided.

## 2020-09-12 NOTE — Telephone Encounter
Patient called to confirm he has received appointment  information.

## 2020-09-24 ENCOUNTER — Encounter: Admit: 2020-09-24 | Discharge: 2020-09-24 | Payer: MEDICARE

## 2020-09-24 NOTE — Telephone Encounter
Left patient a VM stating I would move him to Thursday but I did not know a time for surgery, that he would still get a call 1-2 days prior.

## 2020-09-24 NOTE — Telephone Encounter
Called and left patient a VM about moving his surgery date.

## 2020-09-25 ENCOUNTER — Encounter: Admit: 2020-09-25 | Discharge: 2020-09-25 | Payer: MEDICARE

## 2020-09-27 NOTE — Pre-Anesthesia Patient Instructions
GENERAL INFORMATION    Before you come to the hospital  If you are having an outpatient procedure, you will need to arrange for a responsible ride/person to accompany you home due to sedation or anesthesia with your procedure. A responsible person is a person who has the ability to identify a change in the patient's status and notify medical personnel.  This is typically a family member or friend.  Public transportation is permitted if you have a responsible person to accompany you.  An [employer], taxi or other public transportation driver is not considered a responsible person to accompany you home.  Bath/Shower Instructions  Take a bath or shower with antibacterial soap the night before or the morning of your procedure. Use clean towels.  Put on clean clothes after bath or shower.  Avoid using lotion and oils.  Leave money, credit cards, jewelry, and any other valuables at home. [facility] is not responsible for the loss or breakage of personal items.  Remove nail polish, makeup and all jewelry (including piercings) before coming to the hospital.  The morning of your procedure:  brush your teeth and tongue  do not smoke, vape, chew or use any tobacco products  do not shave the area where you will have surgery    What to bring to the hospital  ID/ Insurance Card  Medical Device card  Official documents for legal guardianship   Copy of your Living Will, Advanced Directives, and/or Durable Power of Attorney.  If you have these documents, please bring them to the admissions office on the day of your surgery to be scanned into your records.  Small bag with a few personal belongings  CPAP/BiPAP machine (including all supplies)  Walker, cane, or motorized scooter  Cases for glasses/hearing aids/contact lens (bring solutions for contacts)  Dress in clean, loose, comfortable clothing     Eating or drinking before surgery  Do not eat or drink anything after 11:00 p.m. the day before your procedure (including gum, mints, candy, or chewing tobacco) OR follow the specific instructions you were given by your Surgeon.  You may have WATER ONLY up to 2 hours before arriving at the hospital.     Other instructions  Notify your surgeon if:  there is a possibility that you are pregnant  you become ill with a cough, fever, sore throat, nausea, vomiting or flu-like symptoms  you have any open wounds/sores that are red, painful, draining, or are new since you last saw the doctor  you need to cancel your procedure    Notify us at [place]: [phone]  if you need to cancel your procedure  if you are going to be late    Arrival at the hospital  [facility]  [address]  [place], [place] [address]    Park in the P5 parking garage located at [address], [place] [address].   If parking in the P5 garage, take the east elevators in the parking garage to the second level and walk to the entrance of the [employer].    Enter through the 1st floor main entrance and check in with Information Desk.   If you are a woman between the ages of 52 and 82, and have not had a hysterectomy, you will be asked for a urine sample prior to surgery.  Please do not urinate before arriving in the Surgery Waiting Room.  Once there, check in and let the attendant know  if you need to provide a sample.    You will receive a call with your surgery arrival time between 2:30pm and 4:30pm the last business day before your procedure.  If you do not receive a call, please call [phone] before 4:30pm or [phone] after 4:30pm.          For the safety of all patients, visitors and staff as we work to contain COVID-19, we must restrict patient visitors.    Current Visitor Policy (09/10/20):    Our current, and ongoing, visitor rules in surgery and procedural areas are:    2 visitors per patient will be allowed to accompany the patient and wait in the Waiting Room.  1 visitor will be allowed into the pre-op and post-op areas - Timing of this is at the discretion of the RN and will likely be upon completion of all pre-op activities and post-op assessments.     Patients in inpatient and pediatric units, Emergency Department, ambulatory clinics and lab appointments may only have two visitors.       For inpatient stays, patients may have 2 visitors at a time at their bedside. The two visitors can change throughout the day, but no more than two at a time may be bedside.  The policy applies to [facility] [place], [address], [place] [facility] [place] campuses and clinics.    Exceptions include:  No visitors allowed for patients with active COVID-19 infections.  Children younger than age 49 are allowed to visit inpatients.  Two parents/guardians are allowed for surgical or procedural patients younger than 72 years old.  Adult inpatients in semiprivate rooms may have visitors, but visits should be coordinated so only two total visitors are in a room at a time due to space limitations.    Visitors must be free of fever and symptoms to be in our facilities. We ask visitors to follow these guidelines:  Wear a mask at all times, unless under the age of 2, have trouble breathing or are unconscious, incapacitated or otherwise unable to remove the cover without assistance.  Go directly to the nursing station in the unit you are visiting and do not linger in public areas.  Check in at the nursing station before going to the patient's room.  Maintain a physical distance of six feet from all others.  Follow elevator restrictions to four riding at a time - peak times are 6:30-7:30 a.m., noon and 6:30-7:30 p.m.  Be aware cafeteria peak times are 11 a.m. - 1 p.m.  Wash your hands frequently and cover your coughs and sneezes.

## 2020-09-28 ENCOUNTER — Encounter: Admit: 2020-09-28 | Discharge: 2020-09-28 | Payer: MEDICARE

## 2020-09-28 ENCOUNTER — Ambulatory Visit: Admit: 2020-09-28 | Discharge: 2020-09-29 | Payer: MEDICARE

## 2020-09-28 DIAGNOSIS — R112 Nausea with vomiting, unspecified: Secondary | ICD-10-CM

## 2020-09-28 DIAGNOSIS — C61 Malignant neoplasm of prostate: Secondary | ICD-10-CM

## 2020-09-28 DIAGNOSIS — E785 Hyperlipidemia, unspecified: Secondary | ICD-10-CM

## 2020-09-28 DIAGNOSIS — G629 Polyneuropathy, unspecified: Secondary | ICD-10-CM

## 2020-09-28 DIAGNOSIS — Z01818 Encounter for other preprocedural examination: Secondary | ICD-10-CM

## 2020-09-28 DIAGNOSIS — I10 Essential (primary) hypertension: Secondary | ICD-10-CM

## 2020-11-01 ENCOUNTER — Encounter: Admit: 2020-11-01 | Discharge: 2020-11-01 | Payer: MEDICARE

## 2020-11-01 DIAGNOSIS — C61 Malignant neoplasm of prostate: Secondary | ICD-10-CM

## 2020-11-01 DIAGNOSIS — G629 Polyneuropathy, unspecified: Secondary | ICD-10-CM

## 2020-11-01 DIAGNOSIS — E785 Hyperlipidemia, unspecified: Secondary | ICD-10-CM

## 2020-11-01 DIAGNOSIS — I10 Essential (primary) hypertension: Secondary | ICD-10-CM

## 2020-11-01 DIAGNOSIS — R112 Nausea with vomiting, unspecified: Secondary | ICD-10-CM

## 2020-11-01 MED ORDER — LIDOCAINE (PF) 200 MG/10 ML (2 %) IJ SYRG
INTRAVENOUS | 0 refills | Status: DC
Start: 2020-11-01 — End: 2020-11-01
  Administered 2020-11-01: 20:00:00 100 mg via INTRAVENOUS

## 2020-11-01 MED ORDER — EPHEDRINE SULFATE 50 MG/5ML SYR (10 MG/ML) (AN)(OSM)
INTRAVENOUS | 0 refills | Status: DC
Start: 2020-11-01 — End: 2020-11-01
  Administered 2020-11-01 (×2): 10 mg via INTRAVENOUS

## 2020-11-01 MED ORDER — FENTANYL CITRATE (PF) 50 MCG/ML IJ SOLN
INTRAVENOUS | 0 refills | Status: DC
Start: 2020-11-01 — End: 2020-11-01
  Administered 2020-11-01: 21:00:00 50 ug via INTRAVENOUS
  Administered 2020-11-01: 20:00:00 75 ug via INTRAVENOUS

## 2020-11-01 MED ORDER — PHENYLEPHRINE HCL IN 0.9% NACL 1 MG/10 ML (100 MCG/ML) IV SYRG
INTRAVENOUS | 0 refills | Status: DC
Start: 2020-11-01 — End: 2020-11-01
  Administered 2020-11-01: 20:00:00 100 ug via INTRAVENOUS

## 2020-11-01 MED ORDER — ONDANSETRON HCL (PF) 4 MG/2 ML IJ SOLN
INTRAVENOUS | 0 refills | Status: DC
Start: 2020-11-01 — End: 2020-11-01
  Administered 2020-11-01: 21:00:00 4 mg via INTRAVENOUS

## 2020-11-01 MED ORDER — SUGAMMADEX 100 MG/ML IV SOLN
INTRAVENOUS | 0 refills | Status: DC
Start: 2020-11-01 — End: 2020-11-01
  Administered 2020-11-01: 21:00:00 100 mg via INTRAVENOUS

## 2020-11-01 MED ORDER — ARTIFICIAL TEARS (PF) SINGLE DOSE DROPS GROUP
OPHTHALMIC | 0 refills | Status: DC
Start: 2020-11-01 — End: 2020-11-01
  Administered 2020-11-01: 20:00:00 2 [drp] via OPHTHALMIC

## 2020-11-01 MED ORDER — PROPOFOL INJ 10 MG/ML IV VIAL
INTRAVENOUS | 0 refills | Status: DC
Start: 2020-11-01 — End: 2020-11-01
  Administered 2020-11-01: 21:00:00 40 mg via INTRAVENOUS
  Administered 2020-11-01: 20:00:00 150 mg via INTRAVENOUS

## 2020-11-01 MED ORDER — SODIUM CHLORIDE 0.9 % IV SOLP
INTRAVENOUS | 0 refills | Status: DC
Start: 2020-11-01 — End: 2020-11-01
  Administered 2020-11-01: 20:00:00 via INTRAVENOUS

## 2020-11-01 MED ORDER — DEXAMETHASONE SODIUM PHOSPHATE 4 MG/ML IJ SOLN
INTRAVENOUS | 0 refills | Status: DC
Start: 2020-11-01 — End: 2020-11-01
  Administered 2020-11-01: 20:00:00 4 mg via INTRAVENOUS

## 2020-11-01 MED ORDER — ROCURONIUM 10 MG/ML IV SOLN
INTRAVENOUS | 0 refills | Status: DC
Start: 2020-11-01 — End: 2020-11-01
  Administered 2020-11-01: 20:00:00 50 mg via INTRAVENOUS

## 2020-11-01 MED ORDER — CEFAZOLIN 1 GRAM IJ SOLR
INTRAVENOUS | 0 refills | Status: DC
Start: 2020-11-01 — End: 2020-11-01
  Administered 2020-11-01: 20:00:00 2 g via INTRAVENOUS

## 2020-11-01 MED ADMIN — FENTANYL CITRATE (PF) 50 MCG/ML IJ SOLN [3037]: 50 ug | INTRAVENOUS | @ 22:00:00 | Stop: 2020-11-02 | NDC 00409909412

## 2020-11-01 MED ADMIN — CEFAZOLIN 1 GRAM IJ SOLR [1445]: 1000 mL | @ 20:00:00 | Stop: 2020-11-01 | NDC 00143992490

## 2020-11-01 MED ADMIN — THROMBIN (BOVINE) 5,000 UNIT TP SOLR [164515]: 5000 [IU] | TOPICAL | @ 20:00:00 | Stop: 2020-11-01 | NDC 60793031501

## 2020-11-01 MED ADMIN — METHOCARBAMOL 750 MG PO TAB [4972]: 750 mg | ORAL | @ 22:00:00 | Stop: 2020-11-01 | NDC 70010077001

## 2020-11-01 MED ADMIN — OXYCODONE 5 MG PO TAB [10814]: 10 mg | ORAL | @ 22:00:00 | Stop: 2020-11-01 | NDC 00904696661

## 2020-11-01 MED ADMIN — HYDROMORPHONE (PF) 2 MG/ML IJ SYRG [163476]: 1 mg | INTRAVENOUS | @ 22:00:00 | Stop: 2020-11-02 | NDC 00409131203

## 2020-11-01 MED ADMIN — BUPIVACAINE-EPINEPHRINE 0.5 %-1:200,000 IJ SOLN [14984]: 10 mL | SUBCUTANEOUS | @ 20:00:00 | Stop: 2020-11-01 | NDC 63323046301

## 2020-11-01 MED ADMIN — SODIUM CHLORIDE 0.9 % IR SOLN [11403]: 1000 mL | @ 20:00:00 | Stop: 2020-11-01 | NDC 00338004804

## 2020-11-01 MED ADMIN — FENTANYL CITRATE (PF) 50 MCG/ML IJ SOLN [3037]: 50 ug | INTRAVENOUS | @ 23:00:00 | Stop: 2020-11-02 | NDC 00409909412

## 2020-11-01 MED ADMIN — GABAPENTIN 300 MG PO CAP [18308]: 300 mg | ORAL | @ 22:00:00 | Stop: 2020-11-01 | NDC 00904666661

## 2020-11-01 MED ADMIN — SODIUM CHLORIDE 0.9 % IV SOLP [27838]: 1000 mL | INTRAVENOUS | @ 19:00:00 | Stop: 2020-11-01 | NDC 00338004904

## 2020-11-01 MED ADMIN — BUPIVACAINE-EPINEPHRINE 0.5 %-1:200,000 IJ SOLN [14984]: 10 mL | SUBCUTANEOUS | @ 21:00:00 | Stop: 2020-11-01 | NDC 63323046301

## 2020-11-02 ENCOUNTER — Encounter: Admit: 2020-11-02 | Discharge: 2020-11-02 | Payer: MEDICARE

## 2020-11-02 MED ADMIN — SENNOSIDES-DOCUSATE SODIUM 8.6-50 MG PO TAB [40926]: 1 | ORAL | @ 02:00:00 | NDC 70000052601

## 2020-11-02 MED ADMIN — ACETAMINOPHEN 325 MG PO TAB [101]: 650 mg | ORAL | @ 02:00:00 | NDC 00904677361

## 2020-11-02 MED ADMIN — GABAPENTIN 300 MG PO CAP [18308]: 300 mg | ORAL | @ 13:00:00 | Stop: 2020-11-02 | NDC 00904666661

## 2020-11-02 MED ADMIN — DOCUSATE SODIUM 100 MG PO CAP [2566]: 100 mg | ORAL | @ 02:00:00 | NDC 00904699880

## 2020-11-02 MED ADMIN — ENALAPRIL MALEATE 10 MG PO TAB [9924]: 10 mg | ORAL | @ 05:00:00 | NDC 00904561061

## 2020-11-02 MED ADMIN — DOCUSATE SODIUM 100 MG PO CAP [2566]: 100 mg | ORAL | @ 13:00:00 | Stop: 2020-11-02 | NDC 00904699880

## 2020-11-02 MED ADMIN — METHOCARBAMOL 750 MG PO TAB [4972]: 750 mg | ORAL | @ 05:00:00 | NDC 70010077001

## 2020-11-02 MED ADMIN — CITALOPRAM 20 MG PO TAB [21062]: 20 mg | ORAL | @ 13:00:00 | Stop: 2020-11-02 | NDC 00904608561

## 2020-11-02 MED ADMIN — CEFAZOLIN INJ 1GM IVP [210319]: 1 g | INTRAVENOUS | @ 05:00:00 | NDC 60505614200

## 2020-11-02 MED ADMIN — MELATONIN 5 MG PO TAB [168576]: 5 mg | ORAL | @ 02:00:00 | NDC 77333052025

## 2020-11-02 MED ADMIN — SENNOSIDES-DOCUSATE SODIUM 8.6-50 MG PO TAB [40926]: 1 | ORAL | @ 13:00:00 | Stop: 2020-11-02 | NDC 70000052601

## 2020-11-02 MED ADMIN — ENALAPRIL MALEATE 10 MG PO TAB [9924]: 10 mg | ORAL | @ 13:00:00 | Stop: 2020-11-02 | NDC 00904561061

## 2020-11-02 MED ADMIN — FENTANYL CITRATE (PF) 50 MCG/ML IJ SOLN [3037]: 50 ug | INTRAVENOUS | @ 02:00:00 | NDC 00409909412

## 2020-11-02 MED ADMIN — OXYCODONE 5 MG PO TAB [10814]: 10 mg | ORAL | @ 11:00:00 | Stop: 2020-11-02 | NDC 00904696661

## 2020-11-02 MED ADMIN — OXYCODONE 5 MG PO TAB [10814]: 10 mg | ORAL | @ 05:00:00 | Stop: 2020-11-02 | NDC 00904696661

## 2020-11-02 MED ADMIN — ACETAMINOPHEN 325 MG PO TAB [101]: 650 mg | ORAL | @ 15:00:00 | Stop: 2020-11-02 | NDC 00904677361

## 2020-11-02 MED ADMIN — OXYCODONE 5 MG PO TAB [10814]: 10 mg | ORAL | @ 15:00:00 | Stop: 2020-11-02 | NDC 00904696661

## 2020-11-02 MED ADMIN — PROPRANOLOL 60 MG PO CS24 [38224]: 120 mg | ORAL | @ 13:00:00 | Stop: 2020-11-02 | NDC 60687021511

## 2020-11-02 MED ADMIN — CEFAZOLIN INJ 1GM IVP [210319]: 1 g | INTRAVENOUS | @ 13:00:00 | Stop: 2020-11-02 | NDC 60505614200

## 2020-11-02 MED ADMIN — METHOCARBAMOL 750 MG PO TAB [4972]: 750 mg | ORAL | @ 13:00:00 | Stop: 2020-11-02 | NDC 70010077001

## 2020-11-02 MED ADMIN — GABAPENTIN 300 MG PO CAP [18308]: 300 mg | ORAL | @ 05:00:00 | NDC 00904666661

## 2020-11-02 MED ADMIN — TRAZODONE 50 MG PO TAB [8085]: 50 mg | ORAL | @ 02:00:00 | NDC 00904686861

## 2020-11-02 MED FILL — METHOCARBAMOL 750 MG PO TAB: 750 mg | ORAL | 20 days supply | Qty: 60 | Fill #1 | Status: CP

## 2020-11-02 MED FILL — OXYCODONE 5 MG PO TAB: 5 mg | ORAL | 5 days supply | Qty: 50 | Fill #1 | Status: CP

## 2020-11-03 ENCOUNTER — Encounter: Admit: 2020-11-03 | Discharge: 2020-11-03 | Payer: MEDICARE

## 2020-11-03 DIAGNOSIS — R112 Nausea with vomiting, unspecified: Secondary | ICD-10-CM

## 2020-11-03 DIAGNOSIS — I10 Essential (primary) hypertension: Secondary | ICD-10-CM

## 2020-11-03 DIAGNOSIS — C61 Malignant neoplasm of prostate: Secondary | ICD-10-CM

## 2020-11-03 DIAGNOSIS — E785 Hyperlipidemia, unspecified: Secondary | ICD-10-CM

## 2020-11-03 DIAGNOSIS — G629 Polyneuropathy, unspecified: Secondary | ICD-10-CM

## 2020-11-15 ENCOUNTER — Encounter: Admit: 2020-11-15 | Discharge: 2020-11-15 | Payer: MEDICARE | Primary: Primary Care

## 2020-11-15 ENCOUNTER — Ambulatory Visit: Admit: 2020-11-15 | Discharge: 2020-11-16 | Payer: MEDICARE | Primary: Primary Care

## 2020-11-15 DIAGNOSIS — E785 Hyperlipidemia, unspecified: Secondary | ICD-10-CM

## 2020-11-15 DIAGNOSIS — R112 Nausea with vomiting, unspecified: Secondary | ICD-10-CM

## 2020-11-15 DIAGNOSIS — M48062 Spinal stenosis, lumbar region with neurogenic claudication: Secondary | ICD-10-CM

## 2020-11-15 DIAGNOSIS — G959 Disease of spinal cord, unspecified: Secondary | ICD-10-CM

## 2020-11-15 DIAGNOSIS — C61 Malignant neoplasm of prostate: Secondary | ICD-10-CM

## 2020-11-15 DIAGNOSIS — I10 Essential (primary) hypertension: Secondary | ICD-10-CM

## 2020-11-15 DIAGNOSIS — G629 Polyneuropathy, unspecified: Secondary | ICD-10-CM

## 2020-11-15 NOTE — Progress Notes
11/15/2020   Obtained patient's verbal consent to treat them and their agreement to [facility] financial policy and NPP via this telehealth visit during the [facility] Emergency    Patient:             Mike Gonzalez  Med Rec #: [id number]  DOB:              05-25-1950    There were no vitals filed for this visit.       History of Present Illness:  Mike Gonzalez is a 70 y.o. male who underwent an L4-5 51 laminectomy on 11/01/2020. He is presented with low back pain and difficulty walking. His pain is worse with activity and relieved with rest. He underwent nonoperative management, but has continued to have progressive pain. 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 After a long discussion with the patient, it was felt that we would attempt the decompressive surgery first with possible fusion surgery in the future. After discussing risks and benefits, he wished to proceed with the decompressive surgery. He returns for follow-up.    The patient reports that he is doing well. He has signs of improvement; however, his feet are still numb. He has more movement in his feet. His legs feel heavy. He feels more comfortable with walking. He does not have much pain in his legs and back. His incision feels tender. He has a knot on his incision. He is able to eat and drink with no complaints. He denies any issues with constipation, but he does have diarrhea. He has not been bending and twisting.    Allergies as of 11/15/2020 - Reviewed 11/15/2020   Allergen Reaction Noted   ? Robaxin [methocarbamol] NAUSEA ONLY 11/15/2020       ? acetaminophen (TYLENOL) 325 mg tablet Take two tablets by mouth every 4 hours as needed.   ? citalopram (CELEXA) 20 mg tablet Take 20 mg by mouth daily.   ? enalapril (VASOTEC) 10 mg tablet Take 10 mg by mouth daily.   ? gabapentin (NEURONTIN) 300 mg capsule Take 300 mg by mouth twice daily.   ? melatonin 5 mg tab Take one tablet by mouth at bedtime daily.   ? methocarbamoL (ROBAXIN) 750 mg tablet Take one tablet by mouth every 8 hours as needed.   ? Omega-3 Fatty Acids 1,000 mg capsule Take 1,000 mg by mouth daily.   ? oxyCODONE (ROXICODONE) 5 mg tablet Take one tablet to two tablets by mouth every 4 hours as needed.   ? propranolol XL (INNOPRAN XL) 120 mg cp24 capsule Take 120 mg by mouth daily.   ? simvastatin (ZOCOR) 80 mg tablet Take 80 mg by mouth daily after lunch.   ? traZODone (DESYREL) 50 mg tablet Take 50 mg by mouth at bedtime daily.        Medical History:   Diagnosis Date   ? HTN (hypertension)    ? Hyperlipidemia    ? Neuropathy     Lt forearm / fingers   ? PONV (postoperative nausea and vomiting)    ? Prostate cancer [facility])         Surgical History:   Procedure Laterality Date   ? PROSTATE SURGERY  2002    seeds implanted   ? DEBRIDEMENT WOUND MEDIUM IN OPEN FRACTURE/ DISLOCATION - UPPER EXTREMITY Left 05/19/2017    Performed by Azzie Glatter, MD at [facility] OR   ?  DEBRIDEMENT WOUND MEDIUM IN OPEN FRACTURE/ DISLOCATION - UPPER EXTREMITY Left 05/22/2017    Performed by Azzie Glatter, MD at [facility] OR   ? APPLICATION NEGATIVE PRESSURE WOUND THERAPY Left 05/22/2017    Performed by Azzie Glatter, MD at [facility] OR   ? OPEN REDUCTION INTERNAL FIXATION SEGMENTAL HUMERUS FRACTURE - HUMERAL SHAFT AND SUPRACONDYLAR FRACTURES Left 05/26/2017    Performed by Azzie Glatter, MD at [facility] OR   ? DEBRIDEMENT WOUND DEEP IN OPEN FRACTURE/ DISLOCATION - UPPER EXTREMITY Left 05/26/2017    Performed by Azzie Glatter, MD at [facility] OR   ? OPEN TREATMENT NONUNION LEFT HUMERUS. Left 08/25/2017    Performed by [facility]zzie Glatter, MD at [facility] OR   ? CYSTOSCOPY WITH FOLEY INSERTION. N/A 08/25/2017    Performed by Azzie Glatter, MD at [facility] OR   ? REMOVAL HARDWARE LEFT HUMERUS. Left 08/25/2017    Performed by Azzie Glatter, MD at [facility] OR   ? REPAIR NONUNION/ MALUNION HUMERUS WITH ILIAC/ ILIAC/ ANOTHER AUTOGRAFT Left 02/23/2018    Performed by Azzie Glatter, MD at [facility] OR   ? 63047-LAMINECTOMY/ FACETECTOMY/ FORAMINOTOMY WITH DECOMPRESSION - 1 VERTEBRAL SEGMENT - LUMBAR  Lumbar 4 to Lumbar 5, Lumbar 5 to Sacral 1 Laminectomy N/A 11/01/2020    Performed by Jobe Marker, MD at [facility] OR   ? 63048-LAMINECTOMY/ FACETECTOMY/ FORAMINOTOMY WITH DECOMPRESSION - 1 VERTEBRAL SEGMENT - EACH ADDITIONAL CERVICAL/ THORACIC/ LUMBAR SEGMENT N/A 11/01/2020    Performed by Jobe Marker, MD at [facility] OR        No flowsheet data found.    Incision: His lumbar incision is healing well.      Physical Exam:  The patient is awake, alert, and oriented. Affect is normal and appropriate.  Patient answers questions appropriately and is conversant.  Speech is clear and fluent. Speech is normal rate and volume without hoarseness.  Facies is symmetric.   Motor exam is spontaneous and symmetric.   Gait is independent without difficulty.    Assessment and Plan:  1. Lumbar stenosis with neurogenic claudication     2. Disease of spinal cord (HCC)         [name] who underwent an L4-5 51 laminectomy on 11/01/2020.    1. Please continue your current wound care regimen.  2. Please continue your current bowel regimen.  3. Please follow-up as previously scheduled with Dr. Alma Friendly on 12/11/2020. All questions were answered.      Total Provider Time:    Total time  9 minutes.  Estimated counseling, exam/ history time is 5 minutes via telehealth visit.      The remainder of the time was spent in EMR review, documentation, coordination or care, and placing orders as detailed above.      Kerrin Mo MD  phone. [phone]  fax. [phone]          ATTESTATION  This visit was documented by [employer] via audio recorded by Kerrin Mo, MD, on November 15, 2020 at 10:58 AM.

## 2020-11-15 NOTE — Patient Instructions
1.  Please continue your current wound care regimen.  2.  Please continue your current bowel regimen.  3.  Please follow-up as previously scheduled.

## 2020-11-16 NOTE — Patient Instructions
It was nice to see you today.  Thank you for choosing to visit our clinic.  Your time is important, and if you had to wait today, we do apologize.  Our goal is to run exactly on time.  However, on occasion, we get behind in clinic due to unexpected patient issues.  Thank you for your patience.    General Instructions:  Scheduling:  Our scheduling phone number is [phone].  How to reach our office:  Please send a MyChart message to the [facility] or leave a voicemail for the nurse,  , RN, BSN at [phone].  How to get a medication refill:  Please use the MyChart Refill request or contact your pharmacy directly to request medication refills.  Please allow 72 business hours for request to be completed.    Support for many chronic illnesses is available through [facility] at [email or web address] or [phone].    For help with MyChart:  please call [phone].    For questions on nights, weekends or holidays:  call the Operator at [phone], and ask for the doctor on call for Neurosurgery.    For more information on spinal conditions:  please visit [email or web address]   Our office fax number is [phone]    Again, thank you for coming in today.         , RN, BSN  Clinical Nurse Coordinator for Dr. Ifije Ohiorhenuan  Marc A. Asher, MD, [facility]  [facility]  Phone [phone]  Fax [phone]

## 2020-11-18 ENCOUNTER — Encounter: Admit: 2020-11-18 | Discharge: 2020-11-18 | Payer: MEDICARE | Primary: Primary Care

## 2020-12-11 ENCOUNTER — Encounter: Admit: 2020-12-11 | Discharge: 2020-12-11 | Payer: MEDICARE | Primary: Primary Care

## 2020-12-11 ENCOUNTER — Ambulatory Visit: Admit: 2020-12-11 | Discharge: 2020-12-11 | Payer: MEDICARE | Primary: Primary Care

## 2020-12-11 DIAGNOSIS — M48062 Spinal stenosis, lumbar region with neurogenic claudication: Secondary | ICD-10-CM

## 2020-12-11 NOTE — Progress Notes
70 year old male with a history of low back pain here for follow-up after recent lumbar laminectomy  Overall he is doing very well  Incision well-healed  Significant improvement in his low back pain.  He also reports that the dorsiflexion in his left foot has improved significantly  He continues to complain of some numbness and tingling his bilateral distal lower extremities.  This predated surgery by 1 to 2 years.  I explained to him that this may take a significant amount of time to get better if it actually does get better  Offer the patient physical therapy.  However, he states that he is doing so well he does not think he needs it at this time  Return to clinic as needed

## 2023-03-20 ENCOUNTER — Encounter: Admit: 2023-03-20 | Discharge: 2023-03-20 | Payer: MEDICARE | Primary: Primary Care
